# Patient Record
Sex: Female | Born: 1980 | Race: White | Hispanic: No | Marital: Single | Smoking: Never smoker
Health system: Southern US, Community
[De-identification: ages and names within clinical notes are randomized; demographics above are authoritative.]

## PROBLEM LIST (undated history)

## (undated) DIAGNOSIS — D649 Anemia, unspecified: Secondary | ICD-10-CM

## (undated) DIAGNOSIS — E079 Disorder of thyroid, unspecified: Secondary | ICD-10-CM

## (undated) HISTORY — DX: Disorder of thyroid, unspecified: E07.9

## (undated) HISTORY — PX: WISDOM TOOTH EXTRACTION: SHX21

## (undated) HISTORY — DX: Anemia, unspecified: D64.9

---

## 2003-06-20 ENCOUNTER — Other Ambulatory Visit: Admission: RE | Admit: 2003-06-20 | Discharge: 2003-06-20 | Payer: Self-pay | Admitting: Obstetrics & Gynecology

## 2004-02-18 ENCOUNTER — Other Ambulatory Visit: Admission: RE | Admit: 2004-02-18 | Discharge: 2004-02-18 | Payer: Self-pay | Admitting: Obstetrics and Gynecology

## 2006-09-18 ENCOUNTER — Inpatient Hospital Stay (HOSPITAL_COMMUNITY): Admission: AD | Admit: 2006-09-18 | Discharge: 2006-09-20 | Payer: Self-pay | Admitting: Obstetrics & Gynecology

## 2007-09-10 ENCOUNTER — Ambulatory Visit: Payer: Self-pay | Admitting: Gynecology

## 2007-09-12 ENCOUNTER — Ambulatory Visit (HOSPITAL_COMMUNITY): Admission: RE | Admit: 2007-09-12 | Discharge: 2007-09-12 | Payer: Self-pay | Admitting: Gynecology

## 2007-09-17 ENCOUNTER — Encounter: Payer: Self-pay | Admitting: Obstetrics & Gynecology

## 2007-09-17 ENCOUNTER — Ambulatory Visit: Payer: Self-pay | Admitting: Gynecology

## 2007-10-08 ENCOUNTER — Ambulatory Visit: Payer: Self-pay | Admitting: Gynecology

## 2007-11-07 ENCOUNTER — Ambulatory Visit: Payer: Self-pay | Admitting: Obstetrics & Gynecology

## 2007-11-26 ENCOUNTER — Ambulatory Visit: Payer: Self-pay | Admitting: Gynecology

## 2007-12-05 ENCOUNTER — Ambulatory Visit: Payer: Self-pay | Admitting: Obstetrics & Gynecology

## 2007-12-20 ENCOUNTER — Ambulatory Visit: Payer: Self-pay | Admitting: Obstetrics & Gynecology

## 2007-12-24 ENCOUNTER — Ambulatory Visit (HOSPITAL_COMMUNITY): Admission: RE | Admit: 2007-12-24 | Discharge: 2007-12-24 | Payer: Self-pay | Admitting: Gynecology

## 2007-12-27 ENCOUNTER — Ambulatory Visit: Payer: Self-pay | Admitting: Obstetrics & Gynecology

## 2007-12-31 ENCOUNTER — Ambulatory Visit: Payer: Self-pay | Admitting: Gynecology

## 2008-01-03 ENCOUNTER — Ambulatory Visit: Payer: Self-pay | Admitting: Gynecology

## 2008-01-07 ENCOUNTER — Ambulatory Visit: Payer: Self-pay | Admitting: Gynecology

## 2008-01-10 ENCOUNTER — Ambulatory Visit: Payer: Self-pay | Admitting: Obstetrics & Gynecology

## 2008-01-14 ENCOUNTER — Ambulatory Visit (HOSPITAL_COMMUNITY): Admission: RE | Admit: 2008-01-14 | Discharge: 2008-01-14 | Payer: Self-pay | Admitting: Obstetrics & Gynecology

## 2008-01-17 ENCOUNTER — Ambulatory Visit: Payer: Self-pay | Admitting: Obstetrics & Gynecology

## 2008-01-21 ENCOUNTER — Ambulatory Visit: Payer: Self-pay | Admitting: Obstetrics & Gynecology

## 2008-01-24 ENCOUNTER — Ambulatory Visit: Payer: Self-pay | Admitting: Obstetrics & Gynecology

## 2008-01-26 ENCOUNTER — Inpatient Hospital Stay (HOSPITAL_COMMUNITY): Admission: AD | Admit: 2008-01-26 | Discharge: 2008-01-28 | Payer: Self-pay | Admitting: Obstetrics & Gynecology

## 2008-01-26 ENCOUNTER — Ambulatory Visit: Payer: Self-pay | Admitting: Family

## 2008-03-20 ENCOUNTER — Ambulatory Visit: Payer: Self-pay | Admitting: Obstetrics & Gynecology

## 2008-08-17 IMAGING — US US OB COMP +14 WK
2 series · 14 of 28 positions shown · non-contrast
Comparison: none

OBSTETRICAL ULTRASOUND:

 This ultrasound exam was performed in the [HOSPITAL] Ultrasound Department.  The OB US report was generated in the AS system, and faxed to the ordering physician.  This report is also available in [REDACTED] PACS.

[Series 1: us ob comp +14 wk · 1 of 5 slices shown (1 of 2)]
[im 5/5]
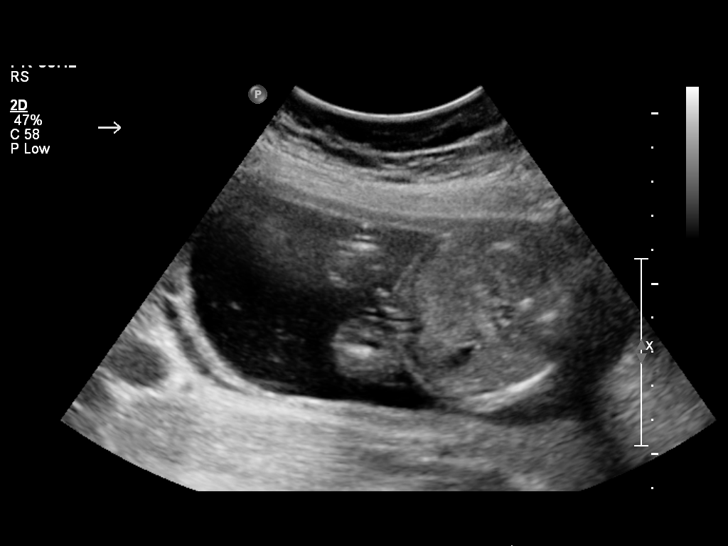

[Series 1: us ob comp +14 wk · 13 of 52 slices shown (2 of 2)]
[im 3/52]
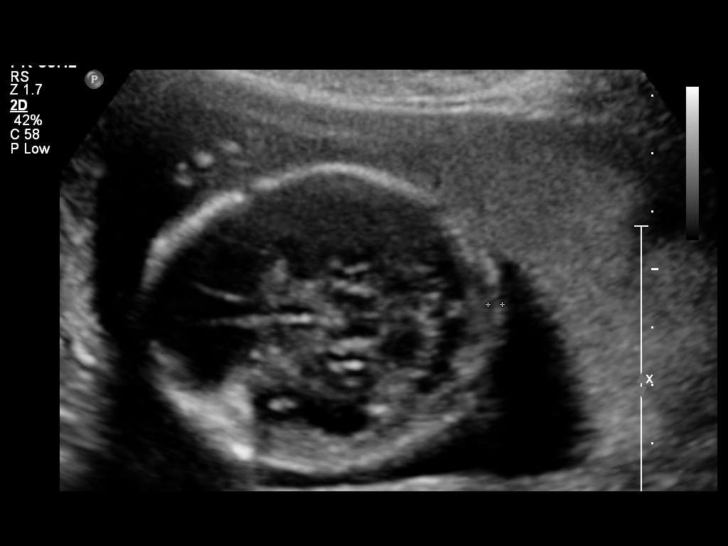
[im 7/52]
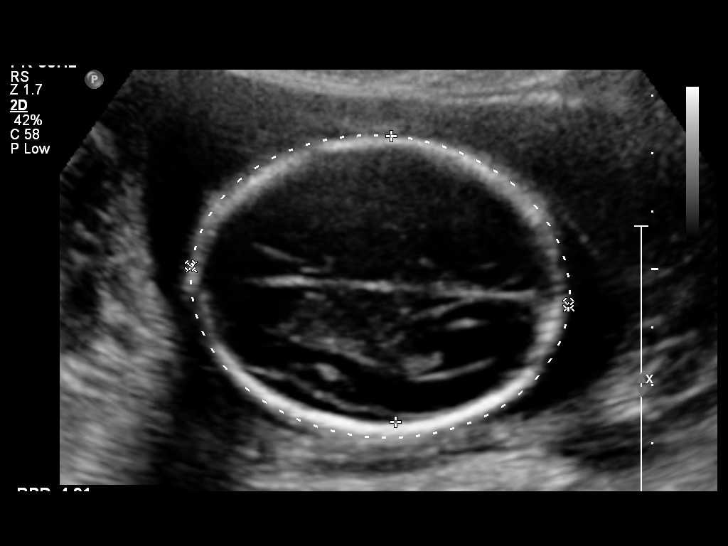
[im 11/52]
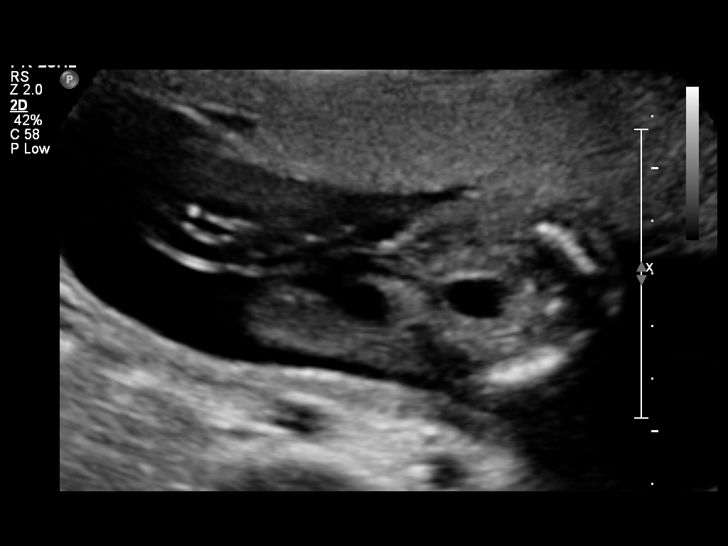
[im 15/52]
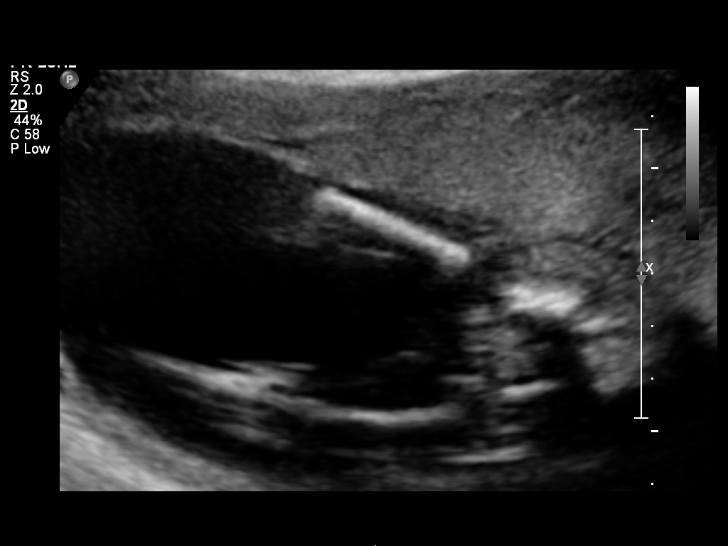
[im 19/52]
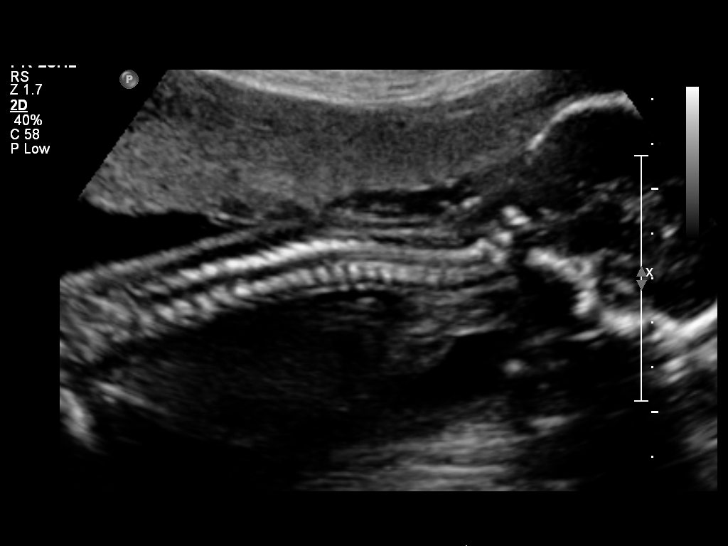
[im 23/52]
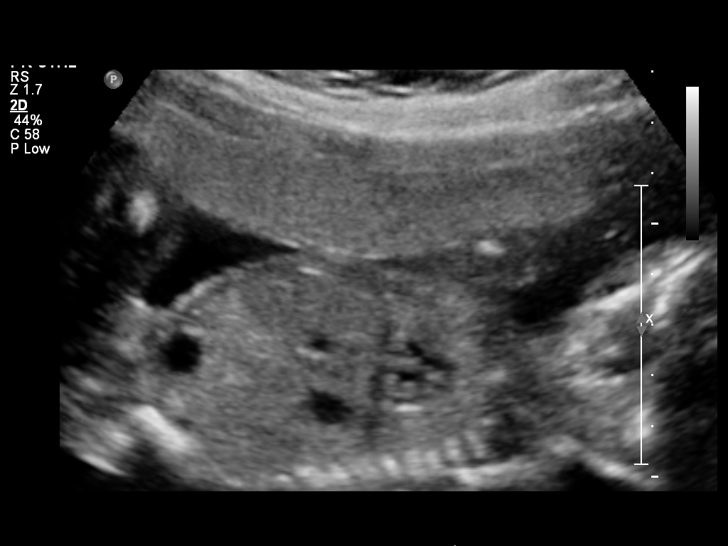
[im 27/52]
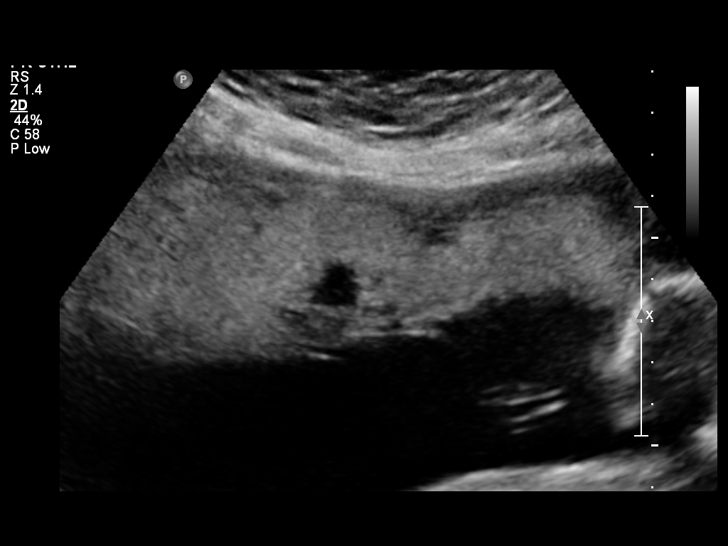
[im 31/52]
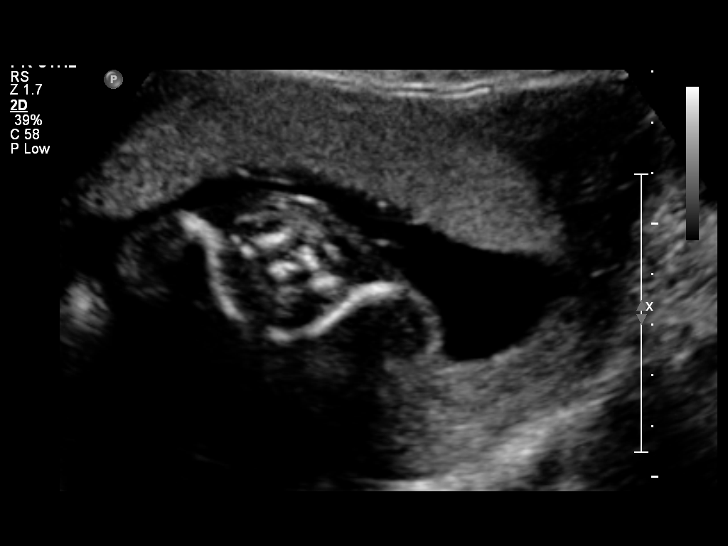
[im 35/52]
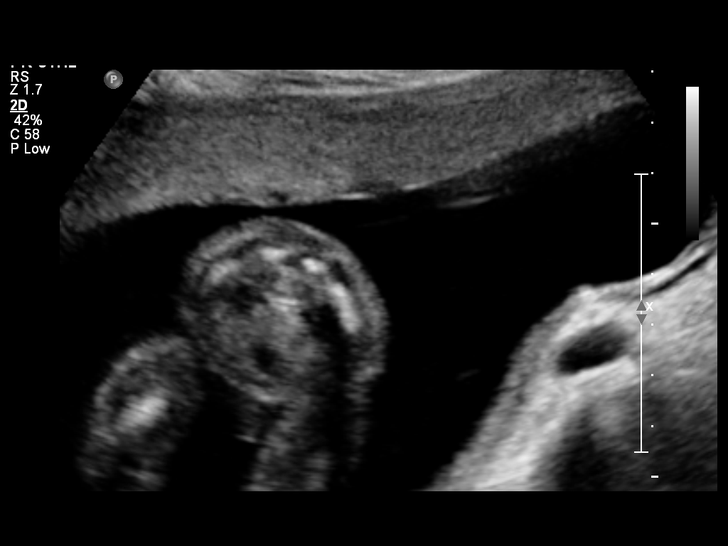
[im 39/52]
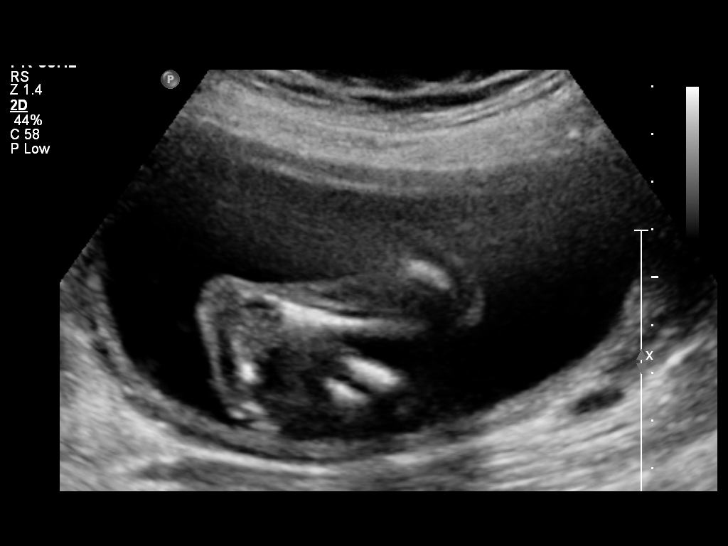
[im 43/52]
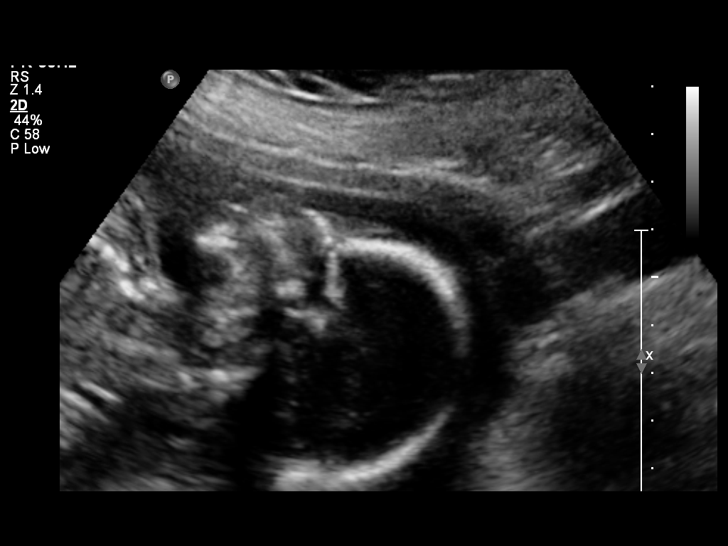
[im 47/52]
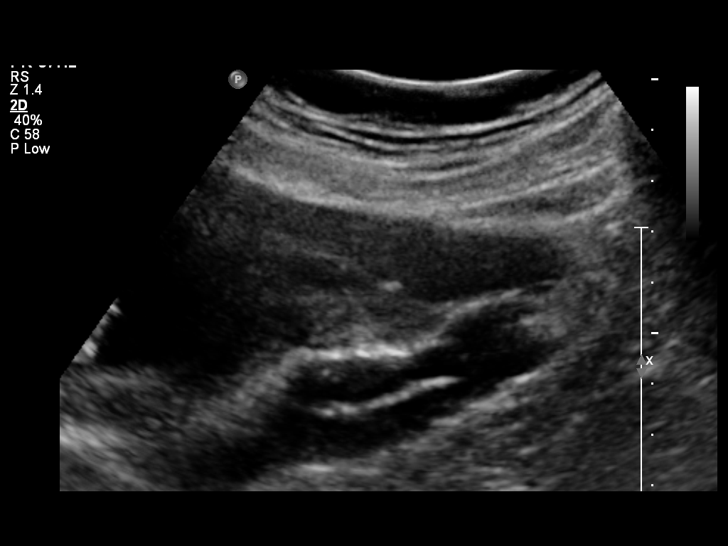
[im 52/52]
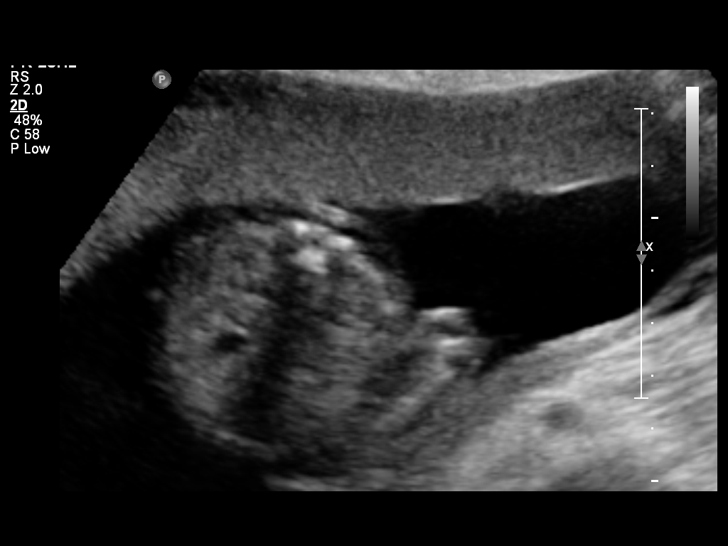

[14 of 28 positions shown; findings below may reference images not displayed]

IMPRESSION: See AS Obstetric US report.

## 2008-11-28 IMAGING — US US OB FOLLOW-UP
1 series · 14 of 28 positions shown · non-contrast
Comparison: none

OBSTETRICAL ULTRASOUND:

 This ultrasound exam was performed in the [HOSPITAL] Ultrasound Department.  The OB US report was generated in the AS system, and faxed to the ordering physician.  This report is also available in [REDACTED] PACS.

[Series 1: us ob re-eval · 44 acquisitions, 14 frames shown]
[im 2/44]
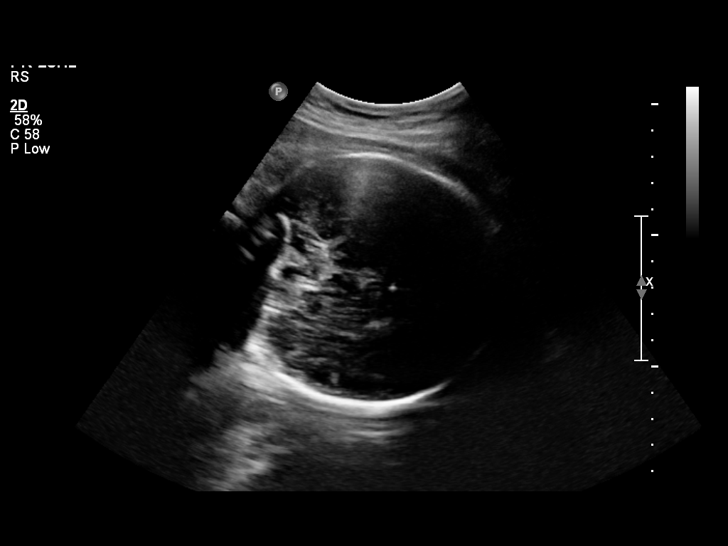
[im 5/44]
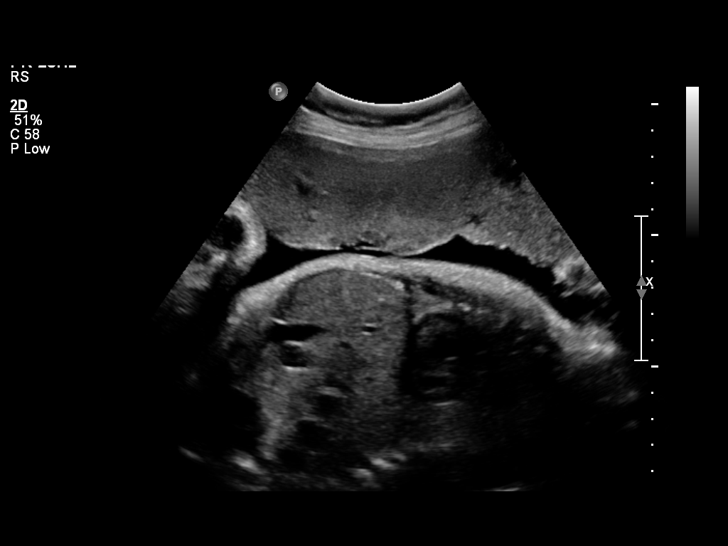
[im 8/44]
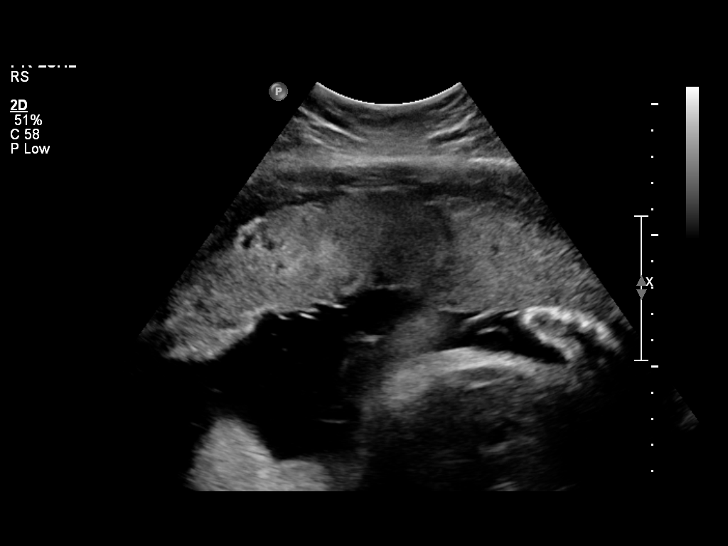
[im 12/44]
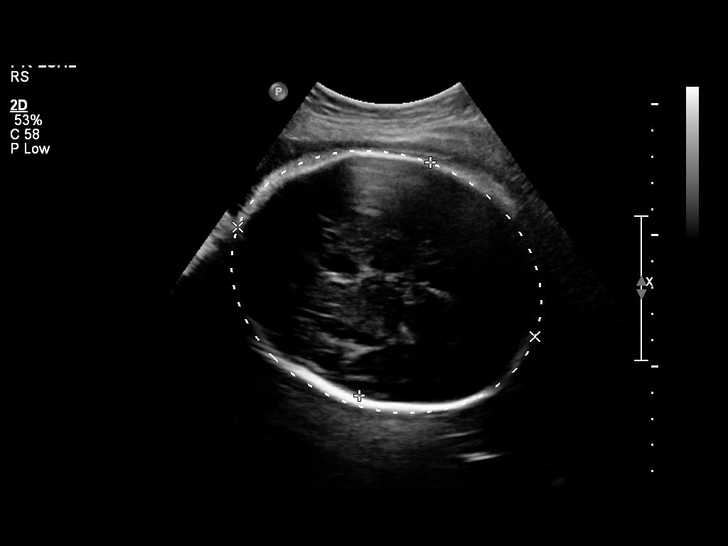
[im 15/44]
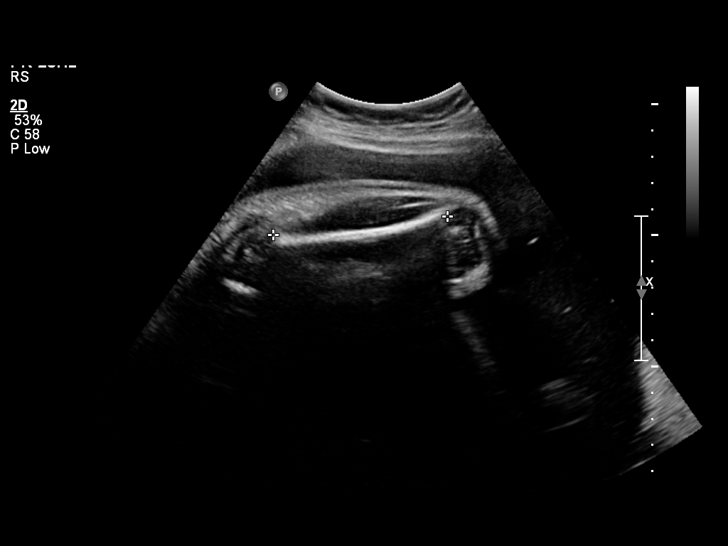
[im 18/44]
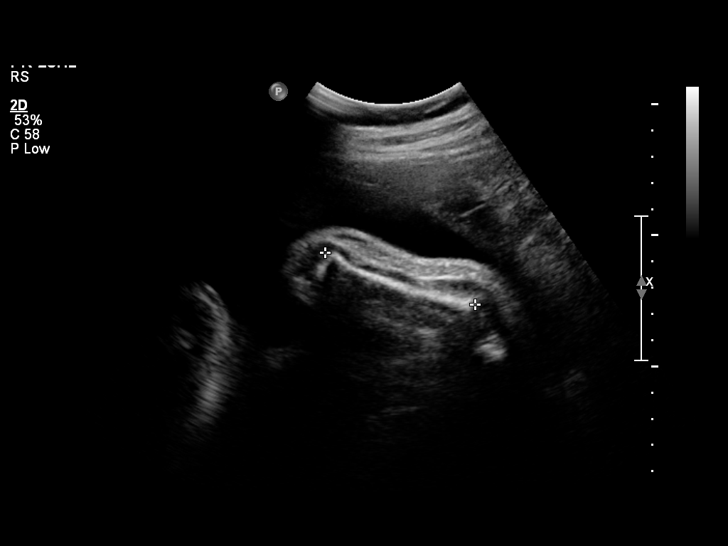
[im 21/44]
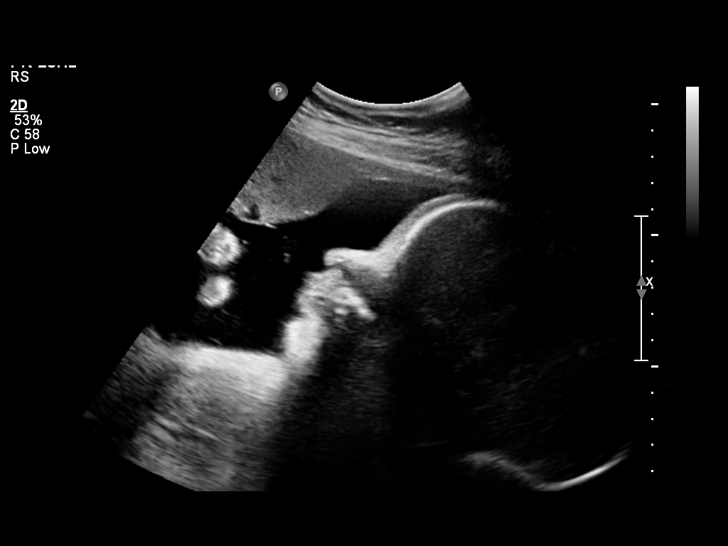
[im 24/44]
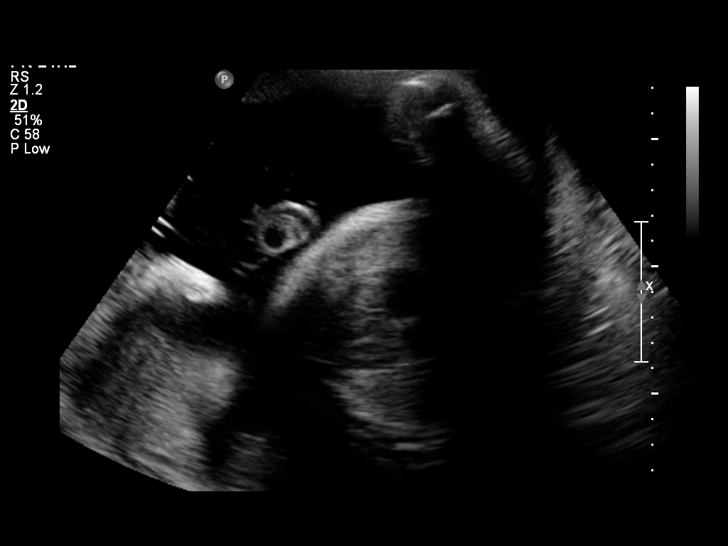
[im 28/44]
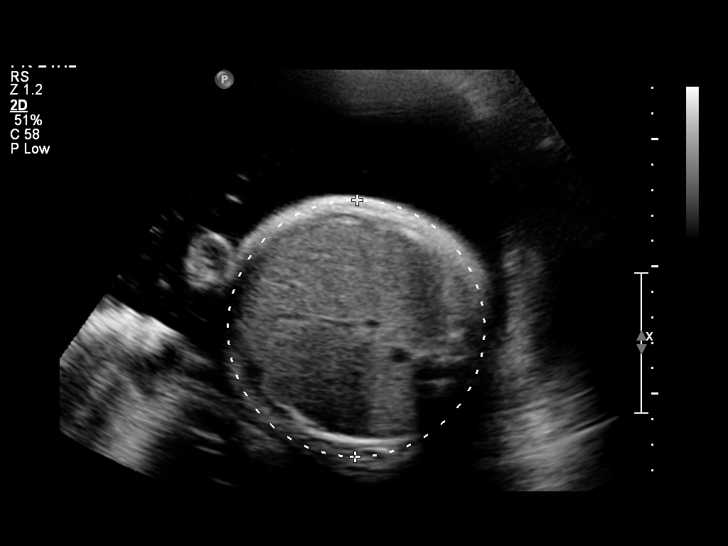
[im 31/44]
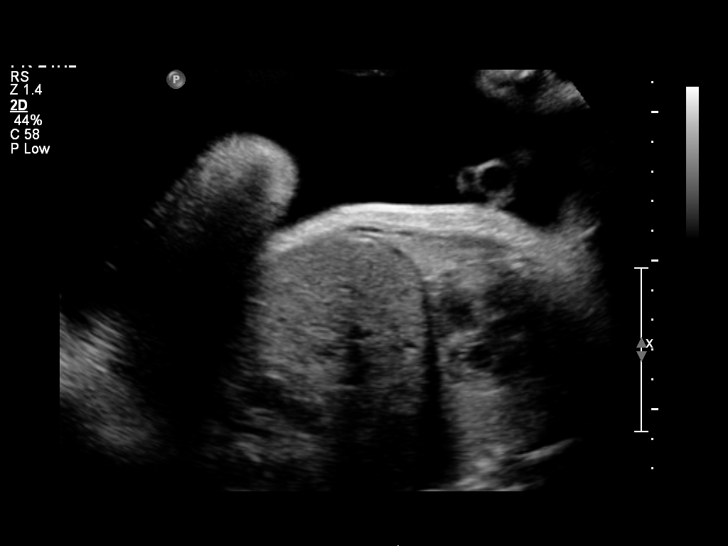
[im 34/44]
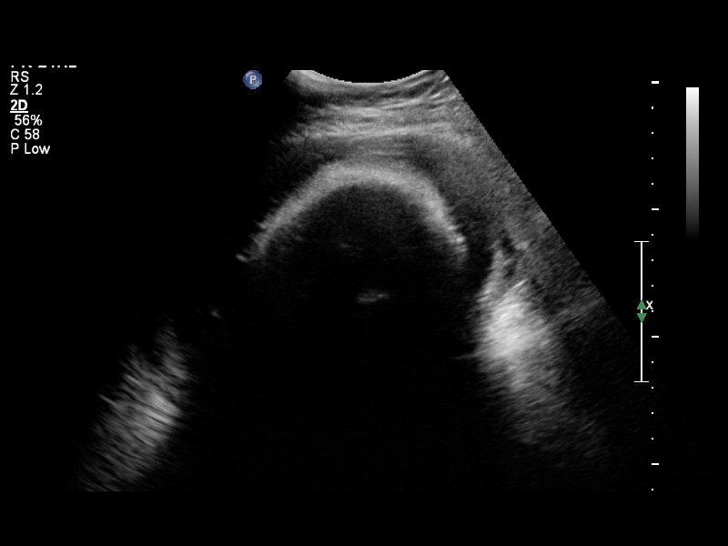
[im 37/44]
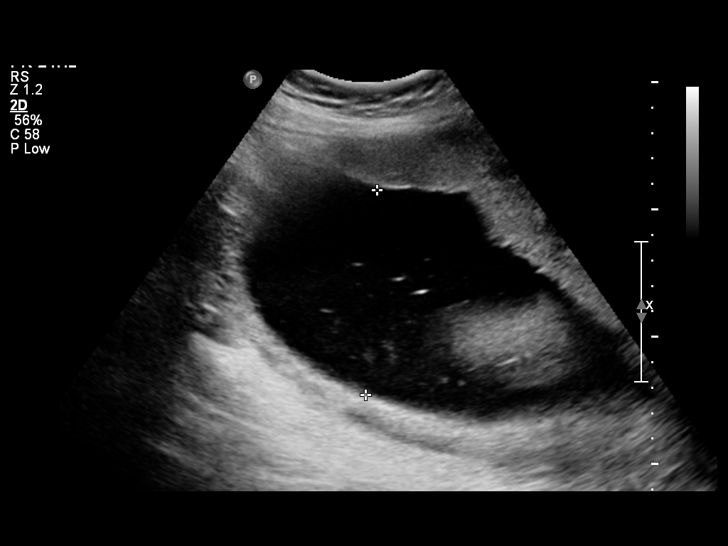
[im 40/44]
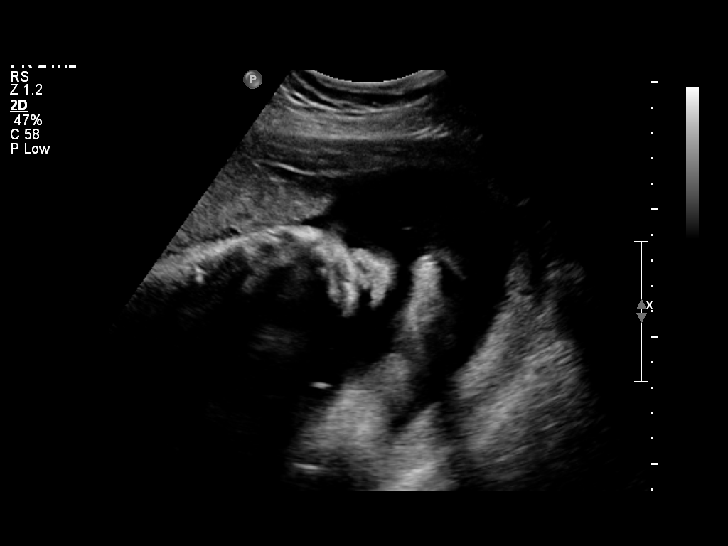
[im 44/44]
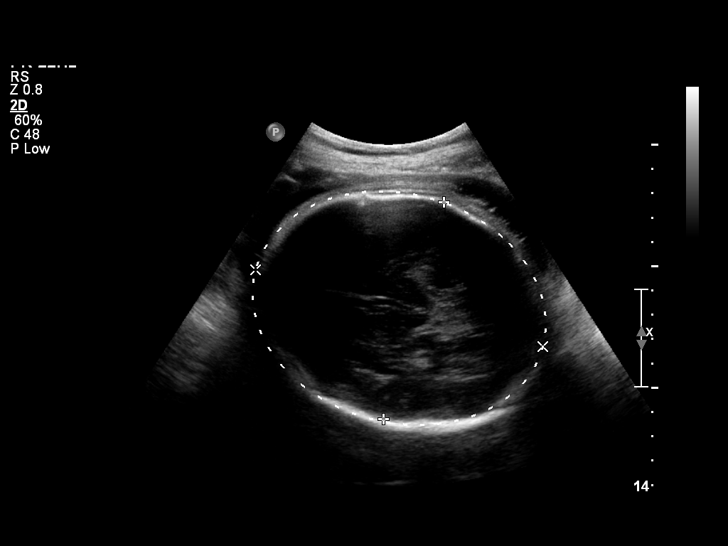

[14 of 28 positions shown; findings below may reference images not displayed]

IMPRESSION: See AS Obstetric US report.

## 2008-12-19 IMAGING — US US FETAL BPP W/O NONSTRESS
1 series · 11 of 11 positions shown · non-contrast
Comparison: none

OBSTETRICAL ULTRASOUND:
 This ultrasound was performed in The [HOSPITAL], and the AS OB/GYN report will be stored to [REDACTED] PACS.

[Series 1: us fetal bpp w/o nonstress · 0.31mm/px · 11 acquisitions, 11 frames shown]
[im 1/11]
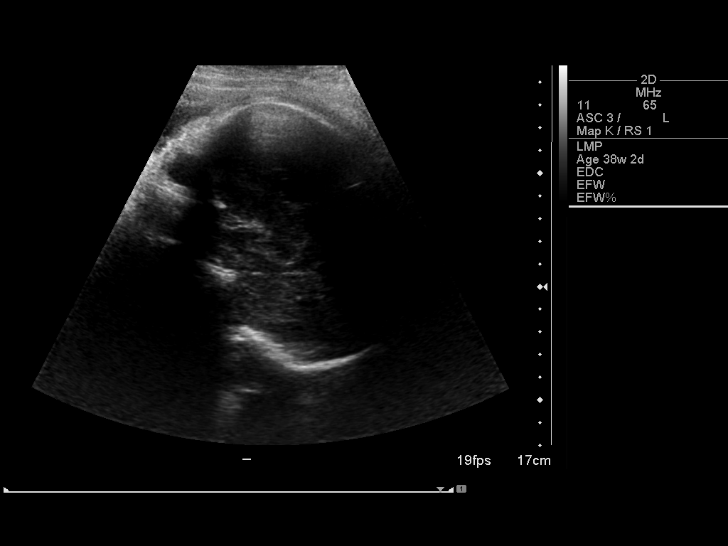
[im 2/11]
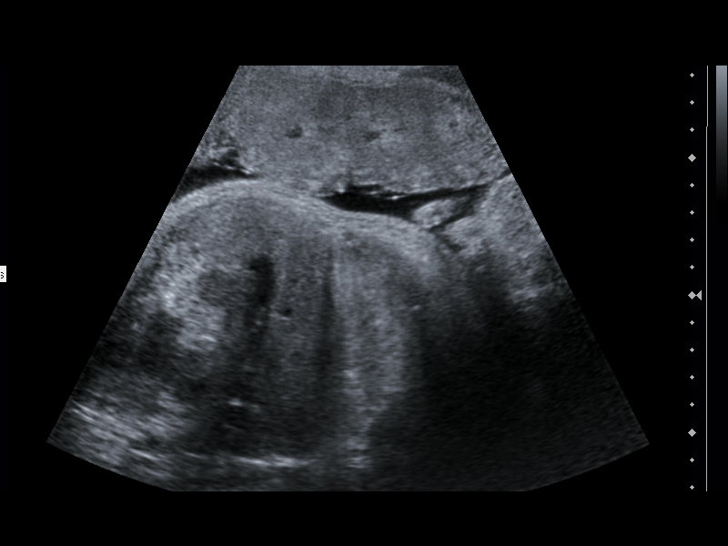
[im 3/11]
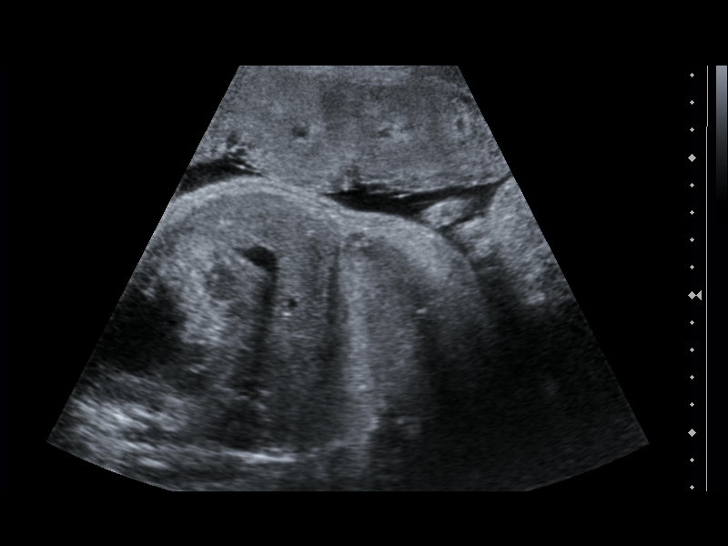
[im 4/11]
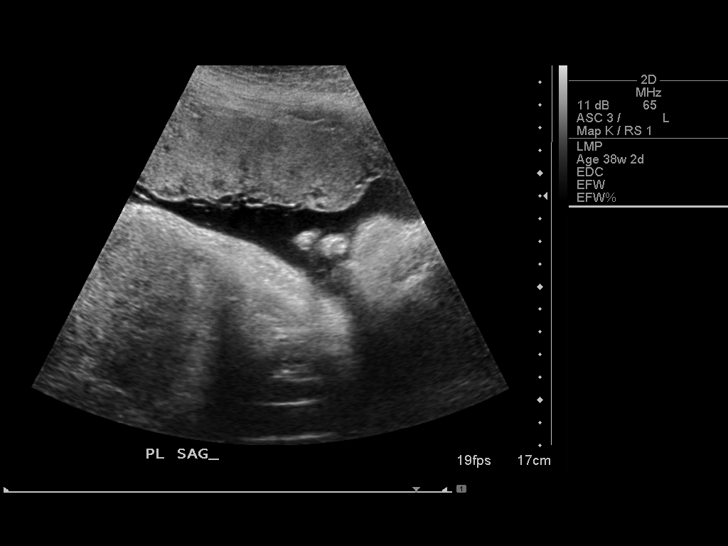
[im 5/11]
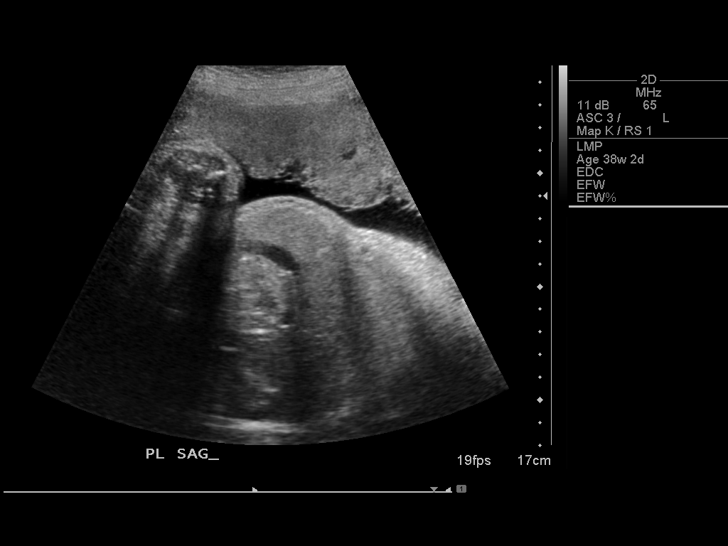
[im 6/11]
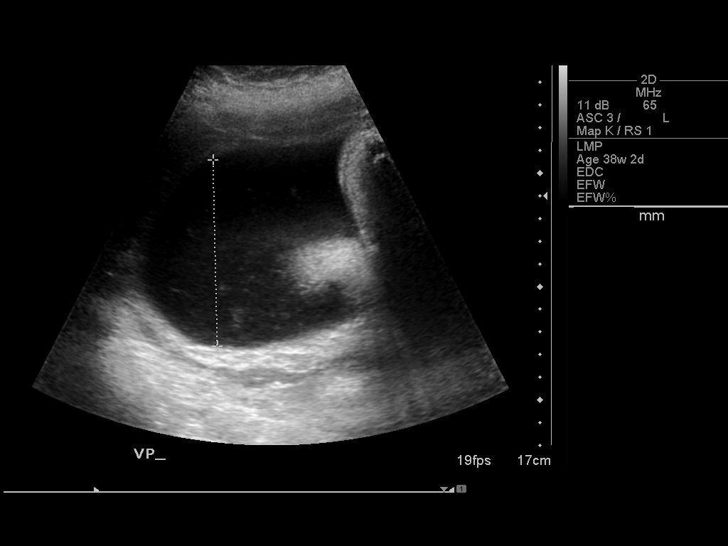
[im 7/11]
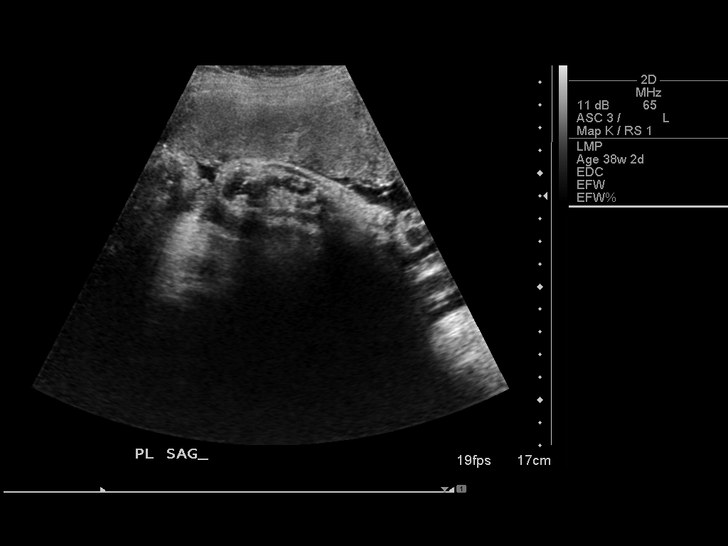
[im 8/11]
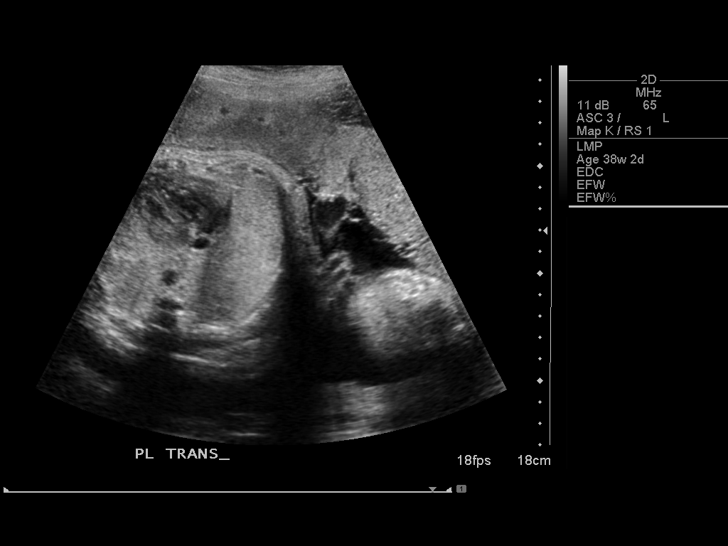
[im 9/11]
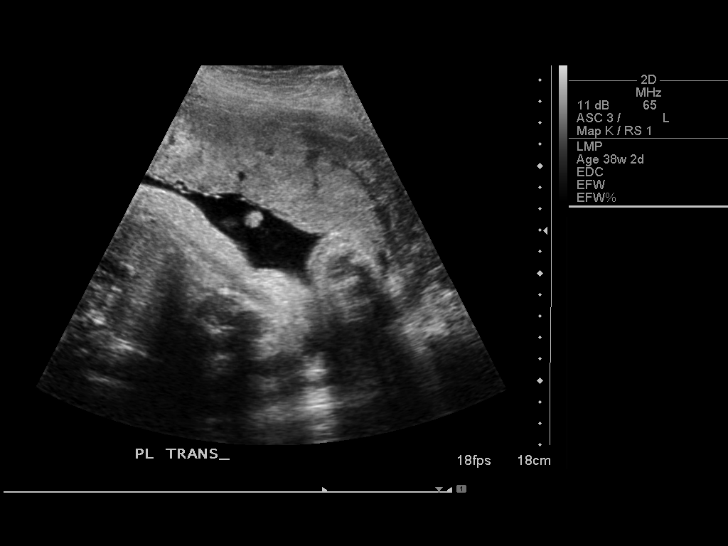
[im 10/11]
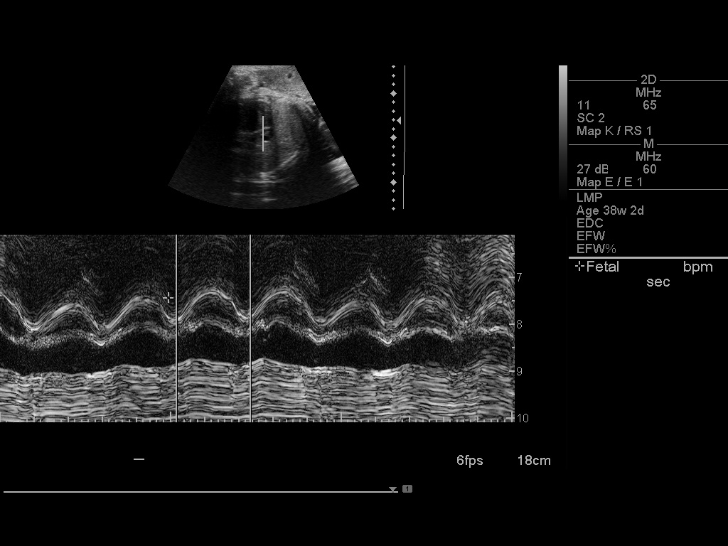
[im 11/11]
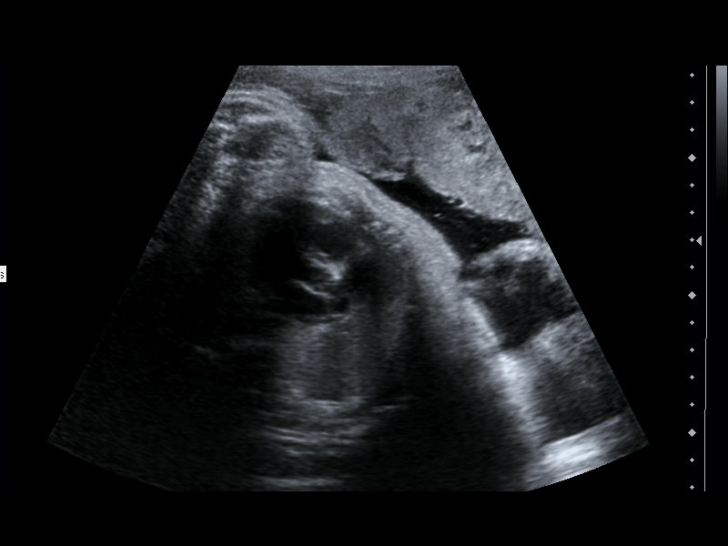

[11 of 11 positions shown; findings below may reference images not displayed]

IMPRESSION: The AS OB/GYN report has also been faxed to the ordering physician.

## 2009-02-26 ENCOUNTER — Encounter: Payer: Self-pay | Admitting: Obstetrics & Gynecology

## 2009-02-26 ENCOUNTER — Other Ambulatory Visit: Admission: RE | Admit: 2009-02-26 | Discharge: 2009-02-26 | Payer: Self-pay | Admitting: Obstetrics & Gynecology

## 2009-02-26 ENCOUNTER — Ambulatory Visit: Payer: Self-pay | Admitting: Obstetrics & Gynecology

## 2009-03-31 ENCOUNTER — Ambulatory Visit: Payer: Self-pay | Admitting: Obstetrics & Gynecology

## 2010-05-24 ENCOUNTER — Other Ambulatory Visit: Admission: RE | Admit: 2010-05-24 | Discharge: 2010-05-24 | Payer: Self-pay | Admitting: Obstetrics and Gynecology

## 2010-05-24 ENCOUNTER — Ambulatory Visit: Payer: Self-pay | Admitting: Obstetrics and Gynecology

## 2010-06-08 ENCOUNTER — Ambulatory Visit: Payer: Self-pay | Admitting: Obstetrics & Gynecology

## 2010-07-08 ENCOUNTER — Ambulatory Visit: Payer: Self-pay | Admitting: Obstetrics & Gynecology

## 2011-03-08 NOTE — Assessment & Plan Note (Signed)
NAME:  [name] [name], [name] [name]                ACCOUNT NO.:  [id number]   MEDICAL RECORD NO.:  [id number]          PATIENT TYPE:  POB   LOCATION:  [facility] at [facility]         FACILITY:  [facility]   PHYSICIAN:  Scheryl Darter, MD       DATE OF BIRTH:  [birth year]   DATE OF SERVICE:                                  CLINIC NOTE   The patient requests IUD insertion.  The patient is a 30 year old white  female gravida 2, para 2, last menstrual period May 24, 2010.  She has  had intercourse one time 1 week ago using condoms since her last period.  She is confident that she is not pregnant.  She had negative pregnancy  test today.  She requests IUD insertion with Mirena.  She has reviewed  the literature on this.  We discussed the method of contraception, risk  of failure bleeding, infection, uterine perforation, and pain.  Questions were answered.  She signed consent.   PHYSICAL EXAMINATION:  GENERAL:  She is in no acute distress and affect  is normal.  PELVIC:  External genitalia, vagina and cervix appeared normal.   Cervix was prepped with Betadine and grasped with single-tooth  tenaculum.  Uterus sounded to 9 cm.  A Mirena was placed in the usual  fashion without difficulty.  String was trimmed to about 3 cm.  She  tolerated this well.  She can return in about 4 weeks to have an IUD  check.  She will report if she has problems with severe pain, bleeding,  discharge, or fever.      Scheryl Darter, MD     JA/MEDQ  D:  06/08/2010  T:  06/09/2010  Job:  [id number]

## 2011-03-08 NOTE — Assessment & Plan Note (Signed)
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  The patient is back for her routine exam and a Pap, has  been using condoms up until now and wanted something more secure for  birth control.  We talked to her about the IUD, about the birth control  pill, NuvaRing, and Depo-Provera.  She has decided on the Mirena IUD.  We are going to give her an information booklet to take home, have her  come back in next week after Pap smear results come back.   PHYSICAL EXAMINATION:  VITAL SIGNS:  Today; her blood pressure was  126/74, weight 167, height 5 feet 4 inches tall.  NECK:  Thyroid symmetrical, no masses.  BREASTS:  Symmetrical.  No dominant masses.  No nipple discharge.  No  supraclavicular or axillary nodes.  ABDOMEN:  Soft, flat, nontender.  No masses.  No organomegaly.  PELVIC:  Genitalia, external is normal.  BUS within normal limits.  Vagina is clean, well rugated.  Cervix is clean, parous with a slight  ectropion.  The uterus is anterior, normal size, shape, consistency, and  the adnexa is normal.   IMPRESSION:  Normal gynecological examination.  Pap smear was taken.  Consultation for contraception.  The patient will return for Mirena  intrauterine device insertion in 1 week.           ______________________________  Argentina Donovan, MD     PR/MEDQ  D:  05/24/2010  T:  05/25/2010  Job:  [id number]

## 2011-03-08 NOTE — Assessment & Plan Note (Signed)
NAME:  [name] [name], [name]                ACCOUNT NO.:  [id number]   MEDICAL RECORD NO.:  [id number]          PATIENT TYPE:  POB   LOCATION:  [facility] at [facility]         FACILITY:  [facility]   PHYSICIAN:  Scheryl Darter, MD       DATE OF BIRTH:  06/02/1981   DATE OF SERVICE:                                  CLINIC NOTE   The patient returns for IUD check.  IUD was placed 1 month ago.  She has  had irregular bleeding and she notes more bleeding after intercourse and  also lower abdominal cramping.  No pain today.  She does have some  spotting.   Physical exam, her affect is normal.  Her abdomen is soft, nontender.  Pelvic exam; external genitalia, vagina and cervix showed moderate  amount of blood..  IUD string was about 3 cm and was noted at the time  of insertion.  Cervix is mildly opened to about a centimeter at the  external os and the tip of the IUD was not palpable through the os.  Uterus is normal size, nontender.  No adnexal masses.   IMPRESSION:  Irregular bleeding after intrauterine device placement.   PLAN:  She will report if her symptoms worsen, otherwise we will manage  expectantly currently.      Scheryl Darter, MD     JA/MEDQ  D:  07/08/2010  T:  07/09/2010  Job:  [id number]

## 2011-03-08 NOTE — Assessment & Plan Note (Signed)
NAME:  [name], [name]                ACCOUNT NO.:  [id number]   MEDICAL RECORD NO.:  [id number]          PATIENT TYPE:  POB   LOCATION:  [facility] at [facility]         FACILITY:  [facility]   PHYSICIAN:  Allie Bossier, MD        DATE OF BIRTH:  Jan 31, 1981   DATE OF SERVICE:  02/26/2009                                  CLINIC NOTE   [name] [name] is a 30 year old single white gravida 2, para 2, who comes  in here for annual exam.  She would like to start some birth control, so  has been abstinent since January, although she lives with her  partner/father of baby.  She does not want another child for more than 5  years in the future.  I have discussed Mirena and Implanon as well as  pills (she was a previous pill user).  I have given her handouts.   PAST MEDICAL HISTORY:  None.   PAST SURGICAL HISTORY:  None.   REVIEW OF SYSTEMS:  She is monogamous for the last 4 years and lives  with father of the baby.  She works at [employer] on [address].  She has no  particular other complaints.   FAMILY HISTORY:  Negative for breast, GYN, and colon malignancy.  Her  father does have diabetes.   SOCIAL HISTORY:  Negative for tobacco, alcohol, or drug use.   ALLERGIES:  No known drug allergies.  No latex allergies.   PHYSICAL EXAMINATION:  VITAL SIGNS:  Weight 173 pounds, blood pressure  117/69, height 5 feet and 4 inches, and pulse is 94.  HEENT:  Normal.  HEART:  Regular rate and rhythm.  LUNGS:  Clear to auscultation bilaterally.  ABDOMEN:  Benign.  BREASTS:  Normal bilaterally.  EXTERNAL GENITALIA:  No lesions.  Cervix parous, no lesions.  Uterus,  normal size and shape, midplane, anteverted, nontender.  Adnexa  nontender, no masses.   ASSESSMENT AND PLAN:  Annual exam.  I checked the Pap smear with  cervical cultures.  We recommended self-breast and self vulvar exams  monthly.  With regard to her birth control, I have given her information  verbal and she will come back in 1-2 weeks with a  decision made of what  she wants.     Allie Bossier, MD    MCD/MEDQ  D:  02/26/2009  T:  02/27/2009  Job:  [id number]

## 2011-03-08 NOTE — Assessment & Plan Note (Signed)
NAME:  [name], [name]                ACCOUNT NO.:  [id number]   MEDICAL RECORD NO.:  [id number]          PATIENT TYPE:  POB   LOCATION:  [facility] at [facility]         FACILITY:  [facility]   PHYSICIAN:  Elsie Lincoln, MD      DATE OF BIRTH:  1980-11-07   DATE OF SERVICE:  03/20/2008                                  CLINIC NOTE    The patient is a 30 year old female who delivered a baby on January 26, 2008.  The patient had a NSVD of a female infant weighing 4170 grams,  which is 9 pounds 3 ounces.  Apgars were 9 and 9.  The patient had a  first-degree perineal laceration and a left labial laceration.  The  patient is not breast feeding, and she denies any complaints of  postpartum depression or mood disturbances.  The patient is not sexually  active yet.  The father of the baby is currently in jail, undergoing  trial for some type of crime.  The patient believes he is innocent and  will be released.  She is not interested in starting a birth control at  this time.   PAST MEDICAL HISTORY:  She has HSV, obesity, and had polyhydramnios in  pregnancy.  She was also GBS positive in pregnancy.   PAST SURGICAL HISTORY:  Denies any surgeries.   REVIEW OF SYSTEMS:  Negative.   MEDICATIONS:  None.   PHYSICAL EXAMINATION:  VITAL SIGNS:  Pulse 63, blood pressure 111/71,  weight 194, and height 5 feet 4-1/2 inches.  GENERAL:  Well-nourished, well-developed, and in no apparent distress.  HEENT:  Normocephalic and atraumatic.  LUNGS:  Normal inspiratory efforts.  BREASTS:  No masses.  No evidence of nipple discharge or mastitis.  ABDOMEN:  Soft and nontender.  GENITALIA:  Tanner V.  Perineum intact.  Vagina, pink and normal rugae.  Cervix, closed and nontender.  Adnexa, no masses and nontender.  Uterus,  normal size and contour.  EXTREMITIES:  No edema.   ASSESSMENT/PLAN:  A 30 year old female for postpartum exam.  1. Pap smear due in November 2009.  2. Come back for birth control, if father of the  baby is released from      jail or if she becomes sexually active.          ______________________________  Elsie Lincoln, MD    KL/MEDQ  D:  03/20/2008  T:  03/20/2008  Job:  [id number]

## 2011-07-18 ENCOUNTER — Ambulatory Visit (INDEPENDENT_AMBULATORY_CARE_PROVIDER_SITE_OTHER): Payer: Self-pay | Admitting: Obstetrics & Gynecology

## 2011-07-18 ENCOUNTER — Encounter: Payer: Self-pay | Admitting: Obstetrics & Gynecology

## 2011-07-18 VITALS — BP 97/61 | HR 71 | Ht 64.0 in | Wt 173.0 lb

## 2011-07-18 DIAGNOSIS — Z30017 Encounter for initial prescription of implantable subdermal contraceptive: Secondary | ICD-10-CM

## 2011-07-18 DIAGNOSIS — Z309 Encounter for contraceptive management, unspecified: Secondary | ICD-10-CM

## 2011-07-18 NOTE — Progress Notes (Signed)
  Subjective:    Patient ID: [name] [name], female    DOB: 1981-03-04, 30 y.o.   MRN: [id number]  HPI Her IUD fell out in the shower yesterday. She has not had sex since then. She wants to wait a few years before having another baby. Her "periods" are mostly occasional spotting.   Review of Systems Pap done 8/11    Objective:   Physical Exam  Questions were answered, a consent signed, and her arm was prepped with betadine.  2 cc of 1% lidocaine was injected into her left arm about 8 cm up from her elbow.  The Implanon was placed without difficulty.  Her arm was bandaged.      Assessment & Plan:  Contraception- Implanon placed She will schedule an annual exam.

## 2011-07-18 NOTE — Progress Notes (Signed)
Mirena IUD fell out in the shower yesterday.  She has been cramping since. Has had the IUD for one year.

## 2011-07-19 LAB — CBC
HCT: 30.8 — ABNORMAL LOW
HCT: 33.9 — ABNORMAL LOW
MCHC: 34.8
Platelets: 217
RBC: 3.49 — ABNORMAL LOW
RBC: 3.9
RDW: 14.8

## 2011-07-19 LAB — CCBB MATERNAL DONOR DRAW

## 2012-04-10 ENCOUNTER — Encounter: Payer: Self-pay | Admitting: Obstetrics and Gynecology

## 2012-04-10 ENCOUNTER — Ambulatory Visit (INDEPENDENT_AMBULATORY_CARE_PROVIDER_SITE_OTHER): Payer: Medicaid Other | Admitting: Obstetrics and Gynecology

## 2012-04-10 VITALS — BP 137/67 | HR 75 | Ht 64.0 in | Wt 173.0 lb

## 2012-04-10 DIAGNOSIS — Z3046 Encounter for surveillance of implantable subdermal contraceptive: Secondary | ICD-10-CM

## 2012-04-10 MED ORDER — NORGESTIMATE-ETH ESTRADIOL 0.25-35 MG-MCG PO TABS: 1.0000 | ORAL_TABLET | Freq: Every day | ORAL | Status: DC

## 2012-04-10 NOTE — Progress Notes (Signed)
Patient ID: [name] [name], female   DOB: April 14, 1981, 31 y.o.   MRN: [id number] 31 yo presenting today requesting Implanon removal. Patient had Implanon placed in 06/2011 and reports heavy irregular bleeding. Patient is aware that it was one of the side effects but did not expect it to be to this extent.  Patient planning on using OCp for birth control. She has taken OCP in the past without any complications  Implanon removal Patient given informed consent for removal of her Implanon, time out was performed.  Signed copy in the chart.  Appropriate time out taken. Implanon site identified.  Area prepped in usual sterile fashon. One cc of 1% lidocaine was used to anesthetize the area at the distal end of the implant. A small stab incision was made right beside the implant on the distal portion.  The implanon rod was grasped using hemostats and removed without difficulty.  There was less than 3 cc blood loss. There were no complications.  A small amount of antibiotic ointment and steri-strips were applied over the small incision.  A pressure bandage was applied to reduce any bruising.  The patient tolerated the procedure well and was given post procedure instructions.

## 2013-12-02 ENCOUNTER — Encounter: Payer: Self-pay | Admitting: Obstetrics & Gynecology

## 2013-12-02 ENCOUNTER — Ambulatory Visit (INDEPENDENT_AMBULATORY_CARE_PROVIDER_SITE_OTHER): Payer: Medicaid Other | Admitting: Obstetrics & Gynecology

## 2013-12-02 VITALS — BP 127/90 | HR 81 | Ht 64.0 in | Wt 175.0 lb

## 2013-12-02 DIAGNOSIS — Z3041 Encounter for surveillance of contraceptive pills: Secondary | ICD-10-CM

## 2013-12-02 DIAGNOSIS — N390 Urinary tract infection, site not specified: Secondary | ICD-10-CM

## 2013-12-02 DIAGNOSIS — R300 Dysuria: Secondary | ICD-10-CM

## 2013-12-02 LAB — POCT URINALYSIS DIPSTICK
Bilirubin, UA: NEGATIVE
Glucose, UA: NEGATIVE
KETONES UA: NEGATIVE
Nitrite, UA: POSITIVE
PH UA: 6
Spec Grav, UA: >=1.03
Urobilinogen, UA: NEGATIVE

## 2013-12-02 MED ORDER — NORGESTIMATE-ETH ESTRADIOL 0.25-35 MG-MCG PO TABS: 1.0000 | ORAL_TABLET | Freq: Every day | ORAL | Status: DC

## 2013-12-02 MED ORDER — CIPROFLOXACIN HCL 500 MG PO TABS: 500.0000 mg | ORAL_TABLET | Freq: Two times a day (BID) | ORAL | Status: DC

## 2013-12-02 MED ORDER — PHENAZOPYRIDINE HCL 200 MG PO TABS: 200.0000 mg | ORAL_TABLET | Freq: Three times a day (TID) | ORAL | Status: DC | PRN

## 2013-12-02 NOTE — Progress Notes (Signed)
   CLINIC ENCOUNTER NOTE  History:  33 y.o.  [id number] here today for burning on urination for 3 days. No lesions or other symptoms.   The following portions of the patient's history were reviewed and updated as appropriate: allergies, current medications, past family history, past medical history, past social history, past surgical history and problem list.  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  Review of Systems:  Pertinent items are noted in HPI.  Objective:  Physical Exam BP 127/90  Pulse 81  Ht 5\' 4"  (1.626 m)  Wt 175 lb (79.379 kg)  BMI 30.02 kg/m2  LMP 11/02/2013 Gen: NAD Abd: Soft, mild suprapubic tenderness and nondistended Back: no CVAT Pelvic: Deferred  Labs and Imaging Results for orders placed in visit on 12/02/13 (from the past 24 hour(s))  POCT URINALYSIS DIPSTICK     Status: Abnormal   Collection Time    12/02/13  2:11 PM      Result Value Range   Color, UA YELLOW     Clarity, UA CLOUDY     Glucose, UA NEGATIVE     Bilirubin, UA NEGATIVE     Ketones, UA NEGATIVE     Spec Grav, UA >=1.030     Blood, UA 3+     pH, UA 6.0     Protein, UA 1+     Urobilinogen, UA negative     Nitrite, UA POSITIVE     Leukocytes, UA small (1+)      Assessment & Plan:  Patient has a UTI Ciprofloxacin prescribed Pyridium also prescribed Will follow up urine culture Return ASAP for annual exam and pap   Jaynie CollinsUGONNA  Abdoul Encinas, MD, FACOG Attending Obstetrician & Gynecologist Faculty Practice, [facility]

## 2013-12-02 NOTE — Patient Instructions (Signed)
Urinary Tract Infection  Urinary tract infections (UTIs) can develop anywhere along your urinary tract. Your urinary tract is your body's drainage system for removing wastes and extra water. Your urinary tract includes two kidneys, two ureters, a bladder, and a urethra. Your kidneys are a pair of bean-shaped organs. Each kidney is about the size of your fist. They are located below your ribs, one on each side of your spine.  CAUSES  Infections are caused by microbes, which are microscopic organisms, including fungi, viruses, and bacteria. These organisms are so small that they can only be seen through a microscope. Bacteria are the microbes that most commonly cause UTIs.  SYMPTOMS   Symptoms of UTIs may vary by age and gender of the patient and by the location of the infection. Symptoms in young women typically include a frequent and intense urge to urinate and a painful, burning feeling in the bladder or urethra during urination. Older women and men are more likely to be tired, shaky, and weak and have muscle aches and abdominal pain. A fever may mean the infection is in your kidneys. Other symptoms of a kidney infection include pain in your back or sides below the ribs, nausea, and vomiting.  DIAGNOSIS  To diagnose a UTI, your caregiver will ask you about your symptoms. Your caregiver also will ask to provide a urine sample. The urine sample will be tested for bacteria and white blood cells. White blood cells are made by your body to help fight infection.  TREATMENT   Typically, UTIs can be treated with medication. Because most UTIs are caused by a bacterial infection, they usually can be treated with the use of antibiotics. The choice of antibiotic and length of treatment depend on your symptoms and the type of bacteria causing your infection.  HOME CARE INSTRUCTIONS   If you were prescribed antibiotics, take them exactly as your caregiver instructs you. Finish the medication even if you feel better after you  have only taken some of the medication.   Drink enough water and fluids to keep your urine clear or pale yellow.   Avoid caffeine, tea, and carbonated beverages. They tend to irritate your bladder.   Empty your bladder often. Avoid holding urine for long periods of time.   Empty your bladder before and after sexual intercourse.   After a bowel movement, women should cleanse from front to back. Use each tissue only once.  SEEK MEDICAL CARE IF:    You have back pain.   You develop a fever.   Your symptoms do not begin to resolve within 3 days.  SEEK IMMEDIATE MEDICAL CARE IF:    You have severe back pain or lower abdominal pain.   You develop chills.   You have nausea or vomiting.   You have continued burning or discomfort with urination.  MAKE SURE YOU:    Understand these instructions.   Will watch your condition.   Will get help right away if you are not doing well or get worse.  Document Released: 07/20/2005 Document Revised: 04/10/2012 Document Reviewed: 11/18/2011  Exit[employer] Patient Information 2014 [employer].

## 2013-12-04 LAB — URINE CULTURE: Colony Count: >=100000

## 2013-12-17 ENCOUNTER — Ambulatory Visit: Payer: Medicaid Other | Admitting: Obstetrics & Gynecology

## 2013-12-24 ENCOUNTER — Ambulatory Visit: Payer: Medicaid Other | Admitting: Obstetrics & Gynecology

## 2014-01-20 ENCOUNTER — Ambulatory Visit (INDEPENDENT_AMBULATORY_CARE_PROVIDER_SITE_OTHER): Payer: Medicaid Other | Admitting: Family Medicine

## 2014-01-20 ENCOUNTER — Encounter: Payer: Self-pay | Admitting: Family Medicine

## 2014-01-20 ENCOUNTER — Other Ambulatory Visit (HOSPITAL_COMMUNITY)
Admission: RE | Admit: 2014-01-20 | Discharge: 2014-01-20 | Disposition: A | Discharge status: Home / Self Care | Payer: Medicaid Other | Source: Ambulatory Visit | Attending: Family Medicine | Admitting: Family Medicine

## 2014-01-20 VITALS — BP 131/65 | HR 73 | Ht 64.0 in | Wt 180.0 lb

## 2014-01-20 DIAGNOSIS — Z113 Encounter for screening for infections with a predominantly sexual mode of transmission: Secondary | ICD-10-CM | POA: Insufficient documentation

## 2014-01-20 DIAGNOSIS — Z124 Encounter for screening for malignant neoplasm of cervix: Secondary | ICD-10-CM | POA: Diagnosis not present

## 2014-01-20 DIAGNOSIS — Z3041 Encounter for surveillance of contraceptive pills: Secondary | ICD-10-CM

## 2014-01-20 DIAGNOSIS — Z01419 Encounter for gynecological examination (general) (routine) without abnormal findings: Secondary | ICD-10-CM | POA: Diagnosis not present

## 2014-01-20 DIAGNOSIS — Z1151 Encounter for screening for human papillomavirus (HPV): Secondary | ICD-10-CM | POA: Insufficient documentation

## 2014-01-20 LAB — CBC
HCT: 38.5 % (ref 36.0–46.0)
HEMOGLOBIN: 13.3 g/dL (ref 12.0–15.0)
MCH: 28.7 pg (ref 26.0–34.0)
MCHC: 34.5 g/dL (ref 30.0–36.0)
MCV: 83 fL (ref 78.0–100.0)
PLATELETS: 306 10*3/uL (ref 150–400)
RBC: 4.64 MIL/uL (ref 3.87–5.11)
RDW: 14.7 % (ref 11.5–15.5)
WBC: 9 10*3/uL (ref 4.0–10.5)

## 2014-01-20 NOTE — Patient Instructions (Signed)

## 2014-01-20 NOTE — Progress Notes (Signed)
  Subjective:     [name] [name] is a 33 y.o. female and is here for a comprehensive physical exam. The patient reports problems - bleeding with birth control.  Bled with Nexplanon and Mirena also.  Not ready for more kids at this point. No pap for 4-5 years. Mild constipation.  History   Social History  . Marital Status: Single    Spouse Name: N/A    Number of Children: N/A  . Years of Education: N/A   Occupational History  . Not on file.   Social History Main Topics  . Smoking status: Never Smoker   . Smokeless tobacco: Never Used  . Alcohol Use: Yes     Comment: rarely  . Drug Use: No  . Sexual Activity: Not on file   Other Topics Concern  . Not on file   Social History Narrative  . No narrative on file   Health Maintenance  Topic Date Due  . Pap Smear  08/29/2000  . Tetanus/tdap  08/29/2001  . Influenza Vaccine  05/24/2013    The following portions of the patient's history were reviewed and updated as appropriate: allergies, current medications, past family history, past medical history, past social history, past surgical history and problem list.  Review of Systems Pertinent items are noted in HPI.   Objective:    BP 131/65  Pulse 73  Ht 5\' 4"  (1.626 m)  Wt 180 lb (81.647 kg)  BMI 30.88 kg/m2  LMP 12/08/2013 General appearance: alert, cooperative and appears stated age Head: Normocephalic, without obvious abnormality, atraumatic Neck: no adenopathy, supple, symmetrical, trachea midline and thyroid not enlarged, symmetric, no tenderness/mass/nodules Lungs: clear to auscultation bilaterally Breasts: normal appearance, no masses or tenderness Heart: regular rate and rhythm, S1, S2 normal, no murmur, click, rub or gallop Abdomen: soft, non-tender; bowel sounds normal; no masses,  no organomegaly Pelvic: cervix normal in appearance, external genitalia normal, no adnexal masses or tenderness, no cervical motion tenderness, uterus normal size, shape, and  consistency and vagina normal without discharge Extremities: extremities normal, atraumatic, no cyanosis or edema Pulses: 2+ and symmetric Skin: Skin color, texture, turgor normal. No rashes or lesions Lymph nodes: Cervical, supraclavicular, and axillary nodes normal. Neurologic: Grossly normal    Assessment:    Healthy female exam.      Plan:     Screening for malignant neoplasm of the cervix - Plan: Cytology - PAP  Routine gynecological examination - Plan: Cytology - PAP, CBC, Comprehensive metabolic panel, TSH, Lipid panel, RPR, HIV antibody  Resume OC's--consider Copper T IUD, if necessary.  See After Visit Summary for Counseling Recommendations

## 2014-01-21 LAB — COMPREHENSIVE METABOLIC PANEL
ALK PHOS: 63 U/L (ref 39–117)
ALT: 9 U/L (ref 0–35)
AST: 14 U/L (ref 0–37)
Albumin: 4.3 g/dL (ref 3.5–5.2)
BILIRUBIN TOTAL: 0.3 mg/dL (ref 0.2–1.2)
BUN: 12 mg/dL (ref 6–23)
CALCIUM: 9.4 mg/dL (ref 8.4–10.5)
CHLORIDE: 102 meq/L (ref 96–112)
CO2: 25 mEq/L (ref 19–32)
Creat: 0.82 mg/dL (ref 0.50–1.10)
Glucose, Bld: 85 mg/dL (ref 70–99)
POTASSIUM: 3.8 meq/L (ref 3.5–5.3)
SODIUM: 136 meq/L (ref 135–145)
TOTAL PROTEIN: 7.1 g/dL (ref 6.0–8.3)

## 2014-01-21 LAB — HIV ANTIBODY (ROUTINE TESTING W REFLEX): HIV: NONREACTIVE

## 2014-01-21 LAB — RPR

## 2014-01-21 LAB — LIPID PANEL
CHOLESTEROL: 156 mg/dL (ref 0–200)
HDL: 38 mg/dL — ABNORMAL LOW (ref >39)
LDL CALC: 86 mg/dL (ref 0–99)
TRIGLYCERIDES: 159 mg/dL — AB (ref <150)
Total CHOL/HDL Ratio: 4.1 Ratio
VLDL: 32 mg/dL (ref 0–40)

## 2014-01-21 LAB — TSH: TSH: 1.74 u[IU]/mL (ref 0.350–4.500)

## 2014-08-25 ENCOUNTER — Encounter: Payer: Self-pay | Admitting: Family Medicine

## 2014-12-01 ENCOUNTER — Other Ambulatory Visit: Payer: Self-pay | Admitting: Obstetrics & Gynecology

## 2022-12-14 ENCOUNTER — Ambulatory Visit (INDEPENDENT_AMBULATORY_CARE_PROVIDER_SITE_OTHER): Payer: Medicaid Other | Admitting: Radiology

## 2022-12-14 ENCOUNTER — Encounter: Payer: Self-pay | Admitting: Radiology

## 2022-12-14 ENCOUNTER — Other Ambulatory Visit (HOSPITAL_COMMUNITY)
Admission: RE | Admit: 2022-12-14 | Discharge: 2022-12-14 | Disposition: A | Discharge status: Home / Self Care | Payer: Medicaid Other | Source: Ambulatory Visit | Attending: Radiology | Admitting: Radiology

## 2022-12-14 VITALS — BP 114/82 | HR 98 | Ht 64.25 in | Wt 242.0 lb

## 2022-12-14 DIAGNOSIS — N926 Irregular menstruation, unspecified: Secondary | ICD-10-CM

## 2022-12-14 DIAGNOSIS — Z01419 Encounter for gynecological examination (general) (routine) without abnormal findings: Secondary | ICD-10-CM

## 2022-12-14 DIAGNOSIS — Z862 Personal history of diseases of the blood and blood-forming organs and certain disorders involving the immune mechanism: Secondary | ICD-10-CM | POA: Diagnosis not present

## 2022-12-14 LAB — CBC
Hemoglobin: 7.3 g/dL — ABNORMAL LOW (ref 11.7–15.5)
RBC: 3.18 10*6/uL — ABNORMAL LOW (ref 3.80–5.10)
RDW: 14.4 % (ref 11.0–15.0)
WBC: 6.2 10*3/uL (ref 3.8–10.8)

## 2022-12-14 NOTE — Progress Notes (Signed)
[name] [name] Jul 14, 1981 [id number]   History:  42 y.o. G2P2 presents for annual exam.Has not seen a doctor in more than 5 years. Pt reports since increased stress started around 01/2022 when son was dx w/ epilepsy and then mother w/ cancer, cycles have become further apart and has even skipped months. Pt denies being SA since 2018 so hasn't been taking/using any BC since then.   Gynecologic History Patient's last menstrual period was 10/26/2022 (exact date).   Contraception/Family planning: abstinence Sexually active: no Last Pap: 2015. Results were: normal, history of abnormal paps   Obstetric History OB History  Gravida Para Term Preterm AB Living  2 2 2     2  $ SAB IAB Ectopic Multiple Live Births               # Outcome Date GA Lbr Len/2nd Weight Sex Delivery Anes PTL Lv  2 Term 01/26/08    Jerilynn Mages Vag-Spont     1 Term 09/18/06    M Vag-Spont        The following portions of the patient's history were reviewed and updated as appropriate: allergies, current medications, past family history, past medical history, past social history, past surgical history, and problem list.  Review of Systems Pertinent items noted in HPI and remainder of comprehensive ROS otherwise negative.   Past medical history, past surgical history, family history and social history were all reviewed and documented in the EPIC chart.   Exam:  Vitals:   12/14/22 1326  BP: 114/82  Pulse: 98  SpO2: 99%  Weight: 242 lb (109.8 kg)  Height: 5' 4.25" (1.632 m)   Body mass index is 41.22 kg/m.  General appearance:  Normal, obese Thyroid:  Symmetrical, normal in size, without palpable masses or nodularity. Respiratory  Auscultation:  Clear without wheezing or rhonchi Cardiovascular  Auscultation:  Regular rate, without rubs, murmurs or gallops  Edema/varicosities:  Not grossly evident Abdominal  Soft,nontender, without masses, guarding or rebound.  Liver/spleen:  No organomegaly noted  Hernia:   None appreciated  Skin  Inspection:  Grossly normal Breasts: Examined lying and sitting.   Right: Without masses, retractions, nipple discharge or axillary adenopathy.   Left: Without masses, retractions, nipple discharge or axillary adenopathy. Genitourinary   Inguinal/mons:  Normal without inguinal adenopathy  External genitalia:  Normal appearing vulva with no masses, tenderness, or lesions  BUS/Urethra/Skene's glands:  Normal without masses or exudate  Vagina:  Normal appearing with normal color and discharge, no lesions  Cervix:  Normal appearing without discharge or lesions  Uterus:  Normal in size, shape and contour.  Mobile, nontender  Adnexa/parametria:     Rt: Normal in size, without masses or tenderness.   Lt: Normal in size, without masses or tenderness.  Anus and perineum: Normal   Patient informed chaperone available to be present for breast and pelvic exam. Patient has requested no chaperone to be present. Patient has been advised what will be completed during breast and pelvic exam.   Assessment/Plan:   1. Well woman exam with routine gynecological exam - encouraged to schedule mammogram - Cytology - PAP( [place]) - Has appt to establish with PCP next month  2. Irregular menses - CBC - Comp Met (CMET) - Thyroid Panel With TSH - FSH - LH - Prolactin - Testos,Total,Free and SHBG (Female) - DHEA-sulfate  3. History of anemia - CBC     Discussed SBE, colonoscopy and DEXA screening as directed/appropriate. Recommend 186mns of exercise weekly, including  weight bearing exercise. Encouraged the use of seatbelts and sunscreen. Return in 1 year for annual or as needed.   Rubbie Battiest B WHNP-BC 1:52 PM 12/14/2022

## 2022-12-15 ENCOUNTER — Other Ambulatory Visit: Payer: Self-pay

## 2022-12-15 DIAGNOSIS — E039 Hypothyroidism, unspecified: Secondary | ICD-10-CM

## 2022-12-15 LAB — LUTEINIZING HORMONE: LH: 2.5 m[IU]/mL

## 2022-12-15 LAB — CYTOLOGY - PAP
Comment: NEGATIVE
Diagnosis: NEGATIVE
High risk HPV: NEGATIVE

## 2022-12-15 LAB — COMPREHENSIVE METABOLIC PANEL
ALT: 7 U/L (ref 6–29)
AST: 11 U/L (ref 10–30)
Alkaline phosphatase (APISO): 93 U/L (ref 31–125)
BUN: 9 mg/dL (ref 7–25)
Calcium: 8.7 mg/dL (ref 8.6–10.2)
Glucose, Bld: 100 mg/dL — ABNORMAL HIGH (ref 65–99)
Potassium: 4 mmol/L (ref 3.5–5.3)
Sodium: 138 mmol/L (ref 135–146)
Total Bilirubin: 0.3 mg/dL (ref 0.2–1.2)

## 2022-12-15 LAB — THYROID PANEL WITH TSH
T4, Total: 6 ug/dL (ref 5.1–11.9)
TSH: 10.81 mIU/L — ABNORMAL HIGH

## 2022-12-15 LAB — FOLLICLE STIMULATING HORMONE: FSH: 4.5 m[IU]/mL

## 2022-12-15 LAB — CBC
MCH: 23 pg — ABNORMAL LOW (ref 27.0–33.0)
MCHC: 30.4 g/dL — ABNORMAL LOW (ref 32.0–36.0)

## 2022-12-15 MED ORDER — LEVOTHYROXINE SODIUM 50 MCG PO TABS: 50.0000 ug | ORAL_TABLET | Freq: Every day | ORAL | 0 refills | Status: DC

## 2022-12-17 LAB — PROLACTIN: Prolactin: 11.3 ng/mL

## 2022-12-17 LAB — COMPREHENSIVE METABOLIC PANEL
AG Ratio: 1.3 (calc) (ref 1.0–2.5)
Albumin: 3.9 g/dL (ref 3.6–5.1)
CO2: 26 mmol/L (ref 20–32)
Chloride: 105 mmol/L (ref 98–110)
Creat: 0.76 mg/dL (ref 0.50–0.99)
Globulin: 3 g/dL (calc) (ref 1.9–3.7)
Total Protein: 6.9 g/dL (ref 6.1–8.1)

## 2022-12-17 LAB — TESTOS,TOTAL,FREE AND SHBG (FEMALE)
Free Testosterone: 1.6 pg/mL (ref 0.1–6.4)
Sex Hormone Binding: 81.3 nmol/L (ref 17–124)
Testosterone, Total, LC-MS-MS: 19 ng/dL (ref 2–45)

## 2022-12-17 LAB — CBC
HCT: 24 % — ABNORMAL LOW (ref 35.0–45.0)
MCV: 75.5 fL — ABNORMAL LOW (ref 80.0–100.0)
MPV: 10.2 fL (ref 7.5–12.5)
Platelets: 394 10*3/uL (ref 140–400)

## 2022-12-17 LAB — DHEA-SULFATE: DHEA-SO4: 122 ug/dL (ref 19–237)

## 2022-12-17 LAB — THYROID PANEL WITH TSH
Free Thyroxine Index: 1.4 (ref 1.4–3.8)
T3 Uptake: 23 % (ref 22–35)

## 2023-01-13 ENCOUNTER — Encounter: Payer: Self-pay | Admitting: Physician Assistant

## 2023-01-13 ENCOUNTER — Ambulatory Visit (INDEPENDENT_AMBULATORY_CARE_PROVIDER_SITE_OTHER): Payer: Medicaid Other | Admitting: Physician Assistant

## 2023-01-13 VITALS — BP 110/54 | HR 71 | Ht 64.0 in | Wt 240.4 lb

## 2023-01-13 DIAGNOSIS — D649 Anemia, unspecified: Secondary | ICD-10-CM

## 2023-01-13 DIAGNOSIS — E039 Hypothyroidism, unspecified: Secondary | ICD-10-CM | POA: Diagnosis not present

## 2023-01-13 DIAGNOSIS — N946 Dysmenorrhea, unspecified: Secondary | ICD-10-CM | POA: Diagnosis not present

## 2023-01-13 MED ORDER — NORETHINDRONE ACETATE 5 MG PO TABS: 5.0000 mg | ORAL_TABLET | Freq: Every day | ORAL | 0 refills | Status: DC

## 2023-01-13 NOTE — Patient Instructions (Signed)
Please contact [phone] to schedule your mammogram. You will be asked your location preference to have procedure performed. You have two options listed below.  1) [facility] located at [address] [place], [place] [address] 2) [facility] [place] located at [address] [place], [place] [address]  Upon results being received our office will contact you. As well as all results can be viewed through your MyChart. Please feel free to contact us if you have any further questions or concerns.   

## 2023-01-13 NOTE — Assessment & Plan Note (Addendum)
Given pt is still bleeding heavily, and hgb was 7.3, I would like to repeat cbc today, also adding iron panel. Concerns that she is still bleeding heavily and her h/h was so low. Pt does not want to start a traditional OCP I would consider, norethindrone, txa.  Message out to gyn for additional info/recommendations, for now rx norethindrone 5 mg daily

## 2023-01-13 NOTE — Progress Notes (Signed)
I,Sha'taria Tyson,acting as a Education administrator for [employer], PA-C.,have documented all relevant documentation on the behalf of Mikey Kirschner, PA-C,as directed by  Mikey Kirschner, PA-C while in the presence of Mikey Kirschner, PA-C.   New patient visit   Patient: [name] [name]   DOB: 1981-04-29   42 y.o. Female  MRN: [id number] Visit Date: 01/13/2023  Today's healthcare provider: Mikey Kirschner, PA-C   Cc. New patient, heavy menstrual cycle  Subjective    [name] [name] is a 42 y.o. female who presents today as a new patient to establish care.   She reports she was recently diagnosed with hypothyroidism from her PCP. She has started on medication. She also reports that starting in January she had a menstrual cycle that lasted 42 days and was extremely heavy. Reports needing multiple super tampons a day for 42 days straight. There was a short break and she started bleeding again 12/31/22 and has not stopped since.  She reports Nov/Dec having some atypical cycle-- skipping Nov and having a slightly longer one in Dec.  Pt reports she found out she was anemic at the GYN and started iron replacement. She has bw scheduled for f/u in May.   Past Medical History:  Diagnosis Date   Anemia    Thyroid disease    Past Surgical History:  Procedure Laterality Date   VAGINAL DELIVERY     two children   WISDOM TOOTH EXTRACTION     Family Status  Relation Name Status   Mother Hassan Rowan Deceased   Father Fritz Pickerel Deceased   Family History  Problem Relation Age of Onset   Lung cancer Mother 59   Fibroids Mother    Heart disease Father 1   Social History   Socioeconomic History   Marital status: Single    Spouse name: Not on file   Number of children: Not on file   Years of education: Not on file   Highest education level: Not on file  Occupational History   Not on file  Tobacco Use   Smoking status: Never   Smokeless tobacco: Never  Substance and Sexual Activity   Alcohol use: Not  Currently   Drug use: No   Sexual activity: Not Currently    Partners: Male    Birth control/protection: Abstinence    Comment: First IC >16, >5 Partners, Hx of CT+, HSV 2+  Other Topics Concern   Not on file  Social History Narrative   Not on file   Social Determinants of Health   Financial Resource Strain: Not on file  Food Insecurity: Not on file  Transportation Needs: Not on file  Physical Activity: Not on file  Stress: Not on file  Social Connections: Not on file   Outpatient Medications Prior to Visit  Medication Sig   Ascorbic Acid (VITAMIN C) 500 MG CAPS Take by mouth.   ferrous sulfate 324 MG TBEC Take 324 mg by mouth daily with breakfast.   levothyroxine (SYNTHROID) 50 MCG tablet Take 1 tablet (50 mcg total) by mouth daily.   No facility-administered medications prior to visit.   No Known Allergies  Immunization History  Administered Date(s) Administered   DTaP 02/03/1988   OPV 02/03/1988   Tdap 03/16/2017    Health Maintenance  Topic Date Due   COVID-19 Vaccine (1) Never done   MAMMOGRAM  Never done   Hepatitis C Screening  Never done   INFLUENZA VACCINE  Never done   PAP SMEAR-Modifier  12/14/2025   DTaP/Tdap/Td (  3 - Td or Tdap) 03/17/2027   HIV Screening  Completed   HPV VACCINES  Aged Out    Patient Care Team: Patient, No Pcp Per as PCP - General (General Practice) Chrzanowski, Annitta Needs, NP as Nurse Practitioner (Radiology)  Review of Systems  Constitutional:  Negative for fatigue and fever.  Respiratory:  Negative for cough and shortness of breath.   Cardiovascular:  Negative for chest pain and leg swelling.  Gastrointestinal:  Negative for abdominal pain.  Genitourinary:  Positive for menstrual problem.  Neurological:  Negative for dizziness and headaches.      Objective    BP (!) 110/54 (BP Location: Left Arm, Patient Position: Sitting, Cuff Size: Large)   Pulse 71   Ht 5\' 4"  (1.626 m)   Wt 240 lb 6.4 oz (109 kg)   LMP 10/26/2022  (Exact Date)   SpO2 100%   BMI 41.26 kg/m  Blood pressure (!) 110/54, pulse 71, height 5\' 4"  (1.626 m), weight 240 lb 6.4 oz (109 kg), last menstrual period 10/26/2022, SpO2 100 %.   Physical Exam Constitutional:      General: She is awake.     Appearance: She is well-developed.  HENT:     Head: Normocephalic.  Eyes:     Conjunctiva/sclera: Conjunctivae normal.  Cardiovascular:     Rate and Rhythm: Normal rate and regular rhythm.     Heart sounds: Normal heart sounds.  Pulmonary:     Effort: Pulmonary effort is normal.     Breath sounds: Normal breath sounds.  Skin:    General: Skin is warm.  Neurological:     Mental Status: She is alert and oriented to person, place, and time.  Psychiatric:        Attention and Perception: Attention normal.        Mood and Affect: Mood normal.        Speech: Speech normal.        Behavior: Behavior is cooperative.    Depression Screen    01/13/2023    2:18 PM  PHQ 2/9 Scores  PHQ - 2 Score 0  PHQ- 9 Score 0   No results found for any visits on 01/13/23.  Assessment & Plan      Problem List Items Addressed This Visit       Endocrine   Hypothyroidism - Primary    Pt managed on levothyroxine 50 mcg Agree with repeat in May, either gyn or myself can adjust/rx        Genitourinary   Dysmenorrhea    Given pt is still bleeding heavily, and hgb was 7.3, I would like to repeat cbc today, also adding iron panel. Concerns that she is still bleeding heavily and her h/h was so low. Pt does not want to start a traditional OCP I would consider, norethindrone, txa.  Message out to gyn for additional info/recommendations, for now rx norethindrone 5 mg daily         Relevant Medications   norethindrone (AYGESTIN) 5 MG tablet     Other   Anemia    Pt taking iron replacement Given low h/h , repeat cbc/ iron panel today If hgb < 7 would ref to heme      Relevant Medications   ferrous sulfate 324 MG TBEC   Other Relevant Orders    CBC w/Diff/Platelet   Iron, TIBC and Ferritin Panel     Return if symptoms worsen or fail to improve.     I, Mikey Kirschner,  PA-C have reviewed all documentation for this visit. The documentation on  01/13/23  for the exam, diagnosis, procedures, and orders are all accurate and complete.  Mikey Kirschner, PA-C [facility] [address] [place], [place], [address] Office: [phone] Fax: [place]

## 2023-01-13 NOTE — Assessment & Plan Note (Signed)
Pt managed on levothyroxine 50 mcg Agree with repeat in May, either gyn or myself can adjust/rx

## 2023-01-13 NOTE — Assessment & Plan Note (Signed)
Pt taking iron replacement Given low h/h , repeat cbc/ iron panel today If hgb < 7 would ref to heme

## 2023-01-14 LAB — IRON,TIBC AND FERRITIN PANEL
Ferritin: 52 ng/mL (ref 15–150)
Iron Saturation: 7 % — CL (ref 15–55)
Iron: 26 ug/dL — ABNORMAL LOW (ref 27–159)
Total Iron Binding Capacity: 353 ug/dL (ref 250–450)
UIBC: 327 ug/dL (ref 131–425)

## 2023-01-14 LAB — CBC WITH DIFFERENTIAL/PLATELET
Basophils Absolute: 0 10*3/uL (ref 0.0–0.2)
Basos: 1 %
EOS (ABSOLUTE): 0.2 10*3/uL (ref 0.0–0.4)
Eos: 3 %
Hematocrit: 31.1 % — ABNORMAL LOW (ref 34.0–46.6)
Hemoglobin: 9.4 g/dL — ABNORMAL LOW (ref 11.1–15.9)
Immature Grans (Abs): 0 10*3/uL (ref 0.0–0.1)
Immature Granulocytes: 0 %
Lymphocytes Absolute: 1.9 10*3/uL (ref 0.7–3.1)
Lymphs: 32 %
MCH: 25.9 pg — ABNORMAL LOW (ref 26.6–33.0)
MCHC: 30.2 g/dL — ABNORMAL LOW (ref 31.5–35.7)
MCV: 86 fL (ref 79–97)
Monocytes Absolute: 0.5 10*3/uL (ref 0.1–0.9)
Monocytes: 9 %
Neutrophils Absolute: 3.4 10*3/uL (ref 1.4–7.0)
Neutrophils: 55 %
Platelets: 376 10*3/uL (ref 150–450)
RBC: 3.63 x10E6/uL — ABNORMAL LOW (ref 3.77–5.28)
RDW: 20.3 % — ABNORMAL HIGH (ref 11.7–15.4)
WBC: 6.1 10*3/uL (ref 3.4–10.8)

## 2023-02-09 ENCOUNTER — Other Ambulatory Visit: Payer: Self-pay | Admitting: Radiology

## 2023-02-09 MED ORDER — SLYND 4 MG PO TABS: 1.0000 | ORAL_TABLET | Freq: Every day | ORAL | 0 refills | Status: DC

## 2023-02-09 NOTE — Telephone Encounter (Signed)
Called patient to schedule OV, no answer.   Routing MyChart message to [place] to review.

## 2023-02-09 NOTE — Telephone Encounter (Signed)
Spoke with patient, advised per [facility],  Patient states she is still taking iron and is agreeable to new RX. Patient aware to f/u with pharmacy for filling, will provide update on medication and symptoms. Questions answered. Patient verbalizes understanding and agreeable.   Slynd Rx pended, pharmacy updated. Jami, please review and sign.  Ok to close encounter once Rx sent.

## 2023-02-09 NOTE — Telephone Encounter (Signed)
I would recommend switching her to [facility] (send to my scripts) to take daily to manage her bleeding and thin the lining. She can continue the Aygestin until the [place] arrives.Please confirm she is taking her iron still.

## 2023-02-13 ENCOUNTER — Encounter: Payer: Self-pay | Admitting: Obstetrics and Gynecology

## 2023-02-13 ENCOUNTER — Ambulatory Visit: Payer: Medicaid Other | Admitting: Obstetrics and Gynecology

## 2023-02-13 VITALS — BP 118/78 | HR 87 | Wt 239.0 lb

## 2023-02-13 DIAGNOSIS — N921 Excessive and frequent menstruation with irregular cycle: Secondary | ICD-10-CM | POA: Diagnosis not present

## 2023-02-13 DIAGNOSIS — D649 Anemia, unspecified: Secondary | ICD-10-CM | POA: Diagnosis not present

## 2023-02-13 LAB — CBC
HCT: 30.1 % — ABNORMAL LOW (ref 35.0–45.0)
Hemoglobin: 9.6 g/dL — ABNORMAL LOW (ref 11.7–15.5)
MCH: 27 pg (ref 27.0–33.0)
MCHC: 31.9 g/dL — ABNORMAL LOW (ref 32.0–36.0)
MCV: 84.8 fL (ref 80.0–100.0)
MPV: 10 fL (ref 7.5–12.5)
Platelets: 412 10*3/uL — ABNORMAL HIGH (ref 140–400)
RBC: 3.55 10*6/uL — ABNORMAL LOW (ref 3.80–5.10)
RDW: 15.4 % — ABNORMAL HIGH (ref 11.0–15.0)
WBC: 9.5 10*3/uL (ref 3.8–10.8)

## 2023-02-13 MED ORDER — NORETHINDRONE ACETATE 5 MG PO TABS: 5.0000 mg | ORAL_TABLET | Freq: Two times a day (BID) | ORAL | 1 refills | Status: DC

## 2023-02-13 NOTE — Progress Notes (Signed)
GYNECOLOGY  VISIT   HPI: 42 y.o.   Single  Caucasian  female   G2P2002 with Patient's last menstrual period was 02/04/2023.   here for   excessive bleeding. Pt has to change tampons every hour. Only slows after iron sample.  Last year would skip her period a couple of times and then have a cycle for 2 weeks but no excessive bleeding.   In January, 2024, started bleeding and clotting that lasted for 42 days.   Seen in this office on 12/14/22 for a well woman visit.  TSH 10.81 and Hgb 7.3 on 12/14/22.  She was started on levothyroxine and iron supplementation.   Her bleeding started again around 12/30/22 and was heavy.   She was seen by her PCP on 01/13/23 and was started on Aygestin 5 mg daily.  Hgb 9.4. Bleeding stopped on 01/16/23.    Bleeding resumed again on 02/04/23 and continues.  She changes a pad about every hour.  Stopped Aygestin on 02/09/23 and started Slynd on 02/10/23.   Not feeling much dizziness or lightheaded.   Not sexually active.  Does not need contraception.   Denies HTN, smoke, migraine with aura, liver or breast disease, personal hx of thromboembolic events.   Took combined contraception in the past and no thromboembolic events.   GYNECOLOGIC HISTORY: Patient's last menstrual period was 02/04/2023. Contraception:  OCP Menopausal hormone therapy:  n/a Last mammogram:  n/a Last pap smear:   12/14/22 neg: HR HPV neg        OB History     Gravida  2   Para  2   Term  2   Preterm      AB      Living  2      SAB      IAB      Ectopic      Multiple      Live Births                 Patient Active Problem List   Diagnosis Date Noted   Dysmenorrhea 01/13/2023   Hypothyroidism 01/13/2023   Anemia 01/13/2023    Past Medical History:  Diagnosis Date   Anemia    Thyroid disease     Past Surgical History:  Procedure Laterality Date   VAGINAL DELIVERY     two children   WISDOM TOOTH EXTRACTION      Current Outpatient Medications   Medication Sig Dispense Refill   Ascorbic Acid (VITAMIN C) 500 MG CAPS Take by mouth.     Drospirenone (SLYND) 4 MG TABS Take 1 tablet (4 mg total) by mouth daily. 84 tablet 0   ferrous sulfate 324 MG TBEC Take 324 mg by mouth daily with breakfast.     levothyroxine (SYNTHROID) 50 MCG tablet Take 1 tablet (50 mcg total) by mouth daily. 90 tablet 0   norethindrone (AYGESTIN) 5 MG tablet Take 1 tablet (5 mg total) by mouth daily. 90 tablet 0   No current facility-administered medications for this visit.     ALLERGIES: Patient has no known allergies.  Family History  Problem Relation Age of Onset   Lung cancer Mother 42   Fibroids Mother    Heart disease Father 74    Social History   Socioeconomic History   Marital status: Single    Spouse name: Not on file   Number of children: Not on file   Years of education: Not on file   Highest education level: Not  on file  Occupational History   Not on file  Tobacco Use   Smoking status: Never   Smokeless tobacco: Never  Substance and Sexual Activity   Alcohol use: Not Currently   Drug use: No   Sexual activity: Not Currently    Partners: Male    Birth control/protection: Abstinence    Comment: First IC >16, >5 Partners, Hx of CT+, HSV 2+  Other Topics Concern   Not on file  Social History Narrative   Not on file   Social Determinants of Health   Financial Resource Strain: Not on file  Food Insecurity: Not on file  Transportation Needs: Not on file  Physical Activity: Not on file  Stress: Not on file  Social Connections: Not on file  Intimate Partner Violence: Not on file    Review of Systems  Genitourinary:  Positive for vaginal bleeding.  All other systems reviewed and are negative.   PHYSICAL EXAMINATION:    BP 118/78 (BP Location: Right Arm, Patient Position: Sitting, Cuff Size: Large)   Pulse 87   Wt 239 lb (108.4 kg)   LMP 02/04/2023   SpO2 100%   BMI 41.02 kg/m     General appearance: alert,  cooperative and appears stated age   Pelvic: External genitalia:  no lesions              Urethra:  normal appearing urethra with no masses, tenderness or lesions              Bartholins and Skenes: normal                 Vagina: normal appearing vagina with normal color and discharge, no lesions              Cervix: no lesions.  Clot from the os.                 Bimanual Exam:  Uterus:  normal size, contour, position, consistency, mobility, non-tender              Adnexa: no mass, fullness, tenderness               Chaperone was present for exam:  Joy J, CMA  ASSESSMENT  Menorrhagia with irregular menses.   Anemia.  Hypothyroidism.   PLAN  Stop Slynd.  Start Aygestin 5 mg po bid.  #60, RF 1.  Check CBC.  Return for pelvic ultrasound and possible endometrial biopsy with me. Call if no improvement in bleeding in 48 hours.   30 min  total time was spent for this patient encounter, including preparation, face-to-face counseling with the patient, coordination of care, and documentation of the encounter.

## 2023-02-13 NOTE — Telephone Encounter (Signed)
Routing to covering provider to review.   Dr. Edward Jolly -please review and advise.

## 2023-02-13 NOTE — Telephone Encounter (Signed)
Call to patient, Left message to call Noreene Larsson, RN at [facility], [phone], option 4.

## 2023-02-13 NOTE — Telephone Encounter (Signed)
Scheduled for OV today with Dr. Edward Jolly.   Routing to provider for final review. Patient is agreeable to disposition. Will close encounter.   Cc; Clearnce Hasten

## 2023-03-07 ENCOUNTER — Other Ambulatory Visit: Payer: Self-pay | Admitting: Radiology

## 2023-03-07 DIAGNOSIS — E039 Hypothyroidism, unspecified: Secondary | ICD-10-CM

## 2023-03-08 NOTE — Telephone Encounter (Signed)
Patient has lab apt. Scheduled 03/10/23. RF approved.

## 2023-03-10 ENCOUNTER — Other Ambulatory Visit: Payer: Self-pay | Admitting: *Deleted

## 2023-03-10 ENCOUNTER — Other Ambulatory Visit: Payer: Self-pay | Admitting: Obstetrics and Gynecology

## 2023-03-10 ENCOUNTER — Other Ambulatory Visit: Payer: Medicaid Other

## 2023-03-10 DIAGNOSIS — D649 Anemia, unspecified: Secondary | ICD-10-CM

## 2023-03-10 DIAGNOSIS — N921 Excessive and frequent menstruation with irregular cycle: Secondary | ICD-10-CM

## 2023-03-10 DIAGNOSIS — E039 Hypothyroidism, unspecified: Secondary | ICD-10-CM

## 2023-03-10 LAB — CBC
MCV: 89.1 fL (ref 80.0–100.0)
RBC: 4.41 10*6/uL (ref 3.80–5.10)
WBC: 7.6 10*3/uL (ref 3.8–10.8)

## 2023-03-10 NOTE — Telephone Encounter (Signed)
Med refill request: norethindrone 5 mg tab PO BID Last AEX: 12/14/22 -BS Next AEX: Not scheduled  PUS 04/25/23  Last MMG (if hormonal med) None  Rx sent 02/13/23 for #60/1RF  Rx refused. Should have 1 refill remaining.   Routing to provider for final review.  Encounter closed.

## 2023-03-11 ENCOUNTER — Other Ambulatory Visit: Payer: Self-pay | Admitting: Obstetrics and Gynecology

## 2023-03-11 DIAGNOSIS — D649 Anemia, unspecified: Secondary | ICD-10-CM

## 2023-03-11 DIAGNOSIS — E039 Hypothyroidism, unspecified: Secondary | ICD-10-CM

## 2023-03-11 LAB — CBC
HCT: 39.3 % (ref 35.0–45.0)
Hemoglobin: 12.5 g/dL (ref 11.7–15.5)
MCH: 28.3 pg (ref 27.0–33.0)
MCHC: 31.8 g/dL — ABNORMAL LOW (ref 32.0–36.0)
MPV: 10.5 fL (ref 7.5–12.5)
Platelets: 419 10*3/uL — ABNORMAL HIGH (ref 140–400)
RDW: 14.4 % (ref 11.0–15.0)

## 2023-03-11 LAB — THYROID PANEL WITH TSH
Free Thyroxine Index: 1.9 (ref 1.4–3.8)
T3 Uptake: 30 % (ref 22–35)
T4, Total: 6.2 ug/dL (ref 5.1–11.9)
TSH: 5.97 mIU/L — ABNORMAL HIGH

## 2023-03-11 MED ORDER — LEVOTHYROXINE SODIUM 75 MCG PO TABS: 75.0000 ug | ORAL_TABLET | Freq: Every day | ORAL | 0 refills | Status: DC

## 2023-04-05 ENCOUNTER — Other Ambulatory Visit: Payer: Self-pay | Admitting: Physician Assistant

## 2023-04-05 DIAGNOSIS — N921 Excessive and frequent menstruation with irregular cycle: Secondary | ICD-10-CM

## 2023-04-25 ENCOUNTER — Other Ambulatory Visit: Payer: Medicaid Other

## 2023-04-25 ENCOUNTER — Other Ambulatory Visit: Payer: Medicaid Other | Admitting: Obstetrics and Gynecology

## 2023-04-25 ENCOUNTER — Ambulatory Visit: Payer: Medicaid Other

## 2023-04-25 DIAGNOSIS — N921 Excessive and frequent menstruation with irregular cycle: Secondary | ICD-10-CM

## 2023-05-06 ENCOUNTER — Other Ambulatory Visit: Payer: Self-pay | Admitting: Obstetrics and Gynecology

## 2023-05-06 DIAGNOSIS — N921 Excessive and frequent menstruation with irregular cycle: Secondary | ICD-10-CM

## 2023-05-08 NOTE — Telephone Encounter (Signed)
Med refill request: Aygestin Last AEX: 12/14/22 Next AEX: not scheduled Last MMG (if hormonal med) n/a Refill authorized: Please Advise, #180, 1 RF

## 2023-05-12 ENCOUNTER — Telehealth: Payer: Self-pay | Admitting: Obstetrics and Gynecology

## 2023-05-12 NOTE — Telephone Encounter (Addendum)
Please contact patient in follow up to her pelvic US done in office 04/25/23.  She had the ultrasound for menorrhagia with irregular menses and anemia.   Her ultrasound shows her uterus has a heart shape to the internal cavity.  She has a tiny cyst near near left ovary.  She has a nabothian cyst on her cervix.  None of these findings are expected to contribute to heavy bleeding.   Please schedule a follow up appointment with me.  I expect she has or will run out of Aygestin soon.  She may still need an endometrial biopsy done.  It would be good to recheck her blood counts as well.

## 2023-05-15 NOTE — Telephone Encounter (Signed)
Mychart msg sent informing pt.  °

## 2023-05-16 NOTE — Telephone Encounter (Signed)
Msg sent to appt desk.  

## 2023-05-17 NOTE — Telephone Encounter (Signed)
Pt scheduled for 05/25/2023. Will route to provider for final review and close.

## 2023-05-23 NOTE — Progress Notes (Unsigned)
GYNECOLOGY  VISIT   HPI: 42 y.o.   Single  Caucasian  female   G2P2002 with Patient's last menstrual period was 04/25/2023 (approximate).   here for an endometrial biopsy for menorrhagia w/ irregular menses and anemia. Last CBC checked 03/10/2023.  CBC 12.5.  Her TSH was elevated to 5.97 on 03/10/23, so her Synthroid was increased to 75 mcg daily.   Her ultrasound 04/25/23 shows her uterus has a heart shape to the internal cavity.  She has a tiny cyst near near left ovary.  She has a nabothian cyst on her cervix.  None of these findings are expected to contribute to heavy bleeding.   Patient is currently managed on Aygestin 5 mg po bid.  Took a couple of weeks for her bleeding to stop.  Started spotting randomly and then some increase after the ultrasound was done.  No bleeding today.  No pain.  Has not run out of Aygestin.  Had problems with bleeding with Mirena IUD. Nexplanon also irregular bleeding.   Declines future childbearing.   UPT negative today.  GYNECOLOGIC HISTORY: Patient's last menstrual period was 04/25/2023 (approximate). Contraception: abstinence Menopausal hormone therapy: none Last mammogram: none Last pap smear: 12/14/2022-WNL, HPV- neg, 01/20/2014-WNL, HPV- neg        OB History     Gravida  2   Para  2   Term  2   Preterm      AB      Living  2      SAB      IAB      Ectopic      Multiple      Live Births                 Patient Active Problem List   Diagnosis Date Noted   Dysmenorrhea 01/13/2023   Hypothyroidism 01/13/2023   Anemia 01/13/2023    Past Medical History:  Diagnosis Date   Anemia    Thyroid disease     Past Surgical History:  Procedure Laterality Date   VAGINAL DELIVERY     two children   WISDOM TOOTH EXTRACTION      Current Outpatient Medications  Medication Sig Dispense Refill   Ascorbic Acid (VITAMIN C) 500 MG CAPS Take by mouth.     ferrous sulfate 324 MG TBEC Take 324 mg by mouth daily with  breakfast.     levothyroxine (SYNTHROID) 75 MCG tablet Take 1 tablet (75 mcg total) by mouth daily. 90 tablet 0   norethindrone (AYGESTIN) 5 MG tablet TAKE 1 TABLET (5 MG TOTAL) BY MOUTH IN THE MORNING AND AT BEDTIME 180 tablet 1   No current facility-administered medications for this visit.     ALLERGIES: Patient has no known allergies.  Family History  Problem Relation Age of Onset   Lung cancer Mother 101   Fibroids Mother    Heart disease Father 77    Social History   Socioeconomic History   Marital status: Single    Spouse name: Not on file   Number of children: Not on file   Years of education: Not on file   Highest education level: Not on file  Occupational History   Not on file  Tobacco Use   Smoking status: Never   Smokeless tobacco: Never  Substance and Sexual Activity   Alcohol use: Not Currently   Drug use: No   Sexual activity: Not Currently    Partners: Male    Birth control/protection: Abstinence  Comment: First IC >16, >5 Partners, Hx of CT+, HSV 2+  Other Topics Concern   Not on file  Social History Narrative   Not on file   Social Determinants of Health   Financial Resource Strain: Not on file  Food Insecurity: Not on file  Transportation Needs: Not on file  Physical Activity: Not on file  Stress: Not on file  Social Connections: Not on file  Intimate Partner Violence: Not on file    Review of Systems  All other systems reviewed and are negative.   PHYSICAL EXAMINATION:    BP 118/72   Pulse 78   LMP 04/25/2023 (Approximate)   SpO2 98%     General appearance: alert, cooperative and appears stated age  EMB Consent done.  Cervix and vaginal normal.  EMB to 10 cm x 2 after Hibiclens prep.  Tissue to pathology. No paracervical block.  No complications.  Minimal EBL.  Chaperone was present for exam:  Rosette Reveal, CMA  ASSESSMENT  Menorrhagia with irregular menses.  Hypothyroidism.  Hx anemia.   PLAN  Pelvic US images and  report reviewed.  FU EMB. Precautions given.  Wean off Aygestin.  Take one pill, 5 mg, by mouth daily for 1 month, and then stop. She wants to do observational management.  If she develops heavy or irregular cycles, will consider Seasonale, already discussed with patient.  Warning signs and potential risks of thromobembolic events reviewed with patient.  She has no contraindications.  Will do follow up labs today for TFTs, CBC, iron, ferritin.  FU prn and for annual exam Feb. 2025.   30 min  total time was spent for this patient encounter, including preparation, face-to-face counseling with the patient, coordination of care, and documentation of the encounter in addition to doing the endometrial biopsy.

## 2023-05-25 ENCOUNTER — Encounter: Payer: Self-pay | Admitting: Obstetrics and Gynecology

## 2023-05-25 ENCOUNTER — Ambulatory Visit: Payer: Medicaid Other | Admitting: Obstetrics and Gynecology

## 2023-05-25 ENCOUNTER — Other Ambulatory Visit (HOSPITAL_COMMUNITY)
Admission: RE | Admit: 2023-05-25 | Discharge: 2023-05-25 | Disposition: A | Discharge status: Home / Self Care | Payer: Medicaid Other | Source: Ambulatory Visit | Attending: Obstetrics and Gynecology | Admitting: Obstetrics and Gynecology

## 2023-05-25 VITALS — BP 118/72 | HR 78

## 2023-05-25 DIAGNOSIS — N921 Excessive and frequent menstruation with irregular cycle: Secondary | ICD-10-CM | POA: Insufficient documentation

## 2023-05-25 DIAGNOSIS — E039 Hypothyroidism, unspecified: Secondary | ICD-10-CM

## 2023-05-25 DIAGNOSIS — Z01812 Encounter for preprocedural laboratory examination: Secondary | ICD-10-CM

## 2023-05-25 DIAGNOSIS — D649 Anemia, unspecified: Secondary | ICD-10-CM | POA: Diagnosis not present

## 2023-05-25 LAB — PREGNANCY, URINE: Preg Test, Ur: NEGATIVE

## 2023-05-25 LAB — CBC
HCT: 47.3 % — ABNORMAL HIGH (ref 35.0–45.0)
Hemoglobin: 15.2 g/dL (ref 11.7–15.5)
MCH: 27.6 pg (ref 27.0–33.0)
MCHC: 32.1 g/dL (ref 32.0–36.0)
MCV: 85.8 fL (ref 80.0–100.0)
MPV: 9.8 fL (ref 7.5–12.5)
Platelets: 386 10*3/uL (ref 140–400)
RBC: 5.51 10*6/uL — ABNORMAL HIGH (ref 3.80–5.10)
RDW: 14 % (ref 11.0–15.0)
WBC: 8.1 10*3/uL (ref 3.8–10.8)

## 2023-05-27 MED ORDER — LEVOTHYROXINE SODIUM 75 MCG PO TABS
75.0000 ug | ORAL_TABLET | Freq: Every day | ORAL | 1 refills | Status: DC
Start: 2023-05-27 — End: 2023-12-04

## 2023-05-27 NOTE — Addendum Note (Signed)
Addended by: Ardell Isaacs, Debbe Bales E on: 05/27/2023 09:15 AM   Modules accepted: Orders

## 2023-05-29 MED ORDER — PROPOFOL 10 MG/ML IV BOLUS
INTRAVENOUS | Status: AC
  Filled 2023-05-29: qty 20

## 2023-05-29 MED ORDER — FENTANYL CITRATE (PF) 250 MCG/5ML IJ SOLN
INTRAMUSCULAR | Status: AC
  Filled 2023-05-29: qty 5

## 2023-05-29 MED ORDER — ROCURONIUM BROMIDE 10 MG/ML (PF) SYRINGE
PREFILLED_SYRINGE | INTRAVENOUS | Status: AC
  Filled 2023-05-29: qty 10

## 2023-05-29 MED ORDER — ONDANSETRON HCL 4 MG/2ML IJ SOLN
INTRAMUSCULAR | Status: AC
  Filled 2023-05-29: qty 2

## 2023-05-29 MED ORDER — LIDOCAINE 2% (20 MG/ML) 5 ML SYRINGE
INTRAMUSCULAR | Status: AC
  Filled 2023-05-29: qty 5

## 2023-05-29 MED ORDER — DEXAMETHASONE SODIUM PHOSPHATE 10 MG/ML IJ SOLN
INTRAMUSCULAR | Status: AC
  Filled 2023-05-29: qty 1

## 2023-05-29 MED ORDER — MIDAZOLAM HCL 2 MG/2ML IJ SOLN
INTRAMUSCULAR | Status: AC
  Filled 2023-05-29: qty 2

## 2023-05-30 ENCOUNTER — Encounter: Payer: Self-pay | Admitting: Obstetrics and Gynecology

## 2023-05-31 ENCOUNTER — Other Ambulatory Visit: Payer: Self-pay | Admitting: Obstetrics and Gynecology

## 2023-05-31 MED ORDER — LEVONORGEST-ETH ESTRAD 91-DAY 0.15-0.03 MG PO TABS: 1.0000 | ORAL_TABLET | Freq: Every day | ORAL | 0 refills | Status: DC

## 2023-05-31 NOTE — Progress Notes (Signed)
Stop Aygestin.  Start Seasonale.

## 2023-08-13 ENCOUNTER — Other Ambulatory Visit: Payer: Self-pay | Admitting: Obstetrics and Gynecology

## 2023-08-14 NOTE — Telephone Encounter (Signed)
Medication refill request: seasonale  Last AEX:  12/14/22 Next AEX: na Last MMG (if hormonal medication request): n/a Refill authorized: #91 pended for today

## 2023-11-14 ENCOUNTER — Other Ambulatory Visit: Payer: Self-pay | Admitting: Obstetrics and Gynecology

## 2023-11-15 NOTE — Telephone Encounter (Signed)
Med refill request: BCPs Last AEX: 12/14/2022-JC Next AEX: none, recall placed for 11/2023 Last MMG (if hormonal med): none/never per EMR investigation.  Refill authorized: rx pend.   Msg sent to appt desk to schedule AEX.

## 2023-11-16 NOTE — Telephone Encounter (Signed)
Pt now scheduled for AEX on 03/12/2024

## 2023-12-03 ENCOUNTER — Other Ambulatory Visit: Payer: Self-pay | Admitting: Obstetrics and Gynecology

## 2023-12-03 DIAGNOSIS — E039 Hypothyroidism, unspecified: Secondary | ICD-10-CM

## 2023-12-04 NOTE — Telephone Encounter (Signed)
 Medication refill request: levothyroxine   Last OV:  05/25/23  Next AEX: 03/12/24 Last MMG (if hormonal medication request): n/a Refill authorized: please advise

## 2023-12-28 ENCOUNTER — Ambulatory Visit: Payer: Medicaid Other | Admitting: Radiology

## 2024-03-08 NOTE — Progress Notes (Signed)
 43 y.o. G20P2002 Single Caucasian female here for annual exam.    Taking Seasonale birth control pills.  Has light cycles at the end of 3 months.  Not having cramping.  Headache at the end of the pack of pills.  Advil will treat it.  It is manageable.   Not sexually active.  Hx of iron deficiency anemia.  She is off her iron.   Notes new onset of some speech stuttering. Wonders if it is related to her thyroid  function.   Has life stress.  Son with autism.  Decreased support system since she lost her parents.   PCP: Trenton Frock, PA-C   Patient's last menstrual period was 03/02/2024 (exact date).     Period Cycle (Days):  (Every 3 months) Period Duration (Days): 2 Menstrual Flow: Light Menstrual Control: Tampon, Maxi pad Dysmenorrhea: None     Sexually active: No.  The current method of family planning is abstinence.    Menopausal hormone therapy:  n/a Exercising: Yes.    Walking Smoker:  no  OB History  Gravida Para Term Preterm AB Living  2 2 2   2   SAB IAB Ectopic Multiple Live Births          # Outcome Date GA Lbr Len/2nd Weight Sex Type Anes PTL Lv  2 Term 01/26/08    M Vag-Spont     1 Term 09/18/06    M Vag-Spont        HEALTH MAINTENANCE: Last 2 paps:  12/14/22 neg HR HPV neg, 01/20/14 neg  History of abnormal Pap or positive HPV:  no Mammogram:   n/a Colonoscopy:  n/a Bone Density:  n/a  Result  n/a   Immunization History  Administered Date(s) Administered   DTaP 02/03/1988   OPV 02/03/1988   Tdap 03/16/2017      reports that she has never smoked. She has never used smokeless tobacco. She reports that she does not currently use alcohol. She reports that she does not use drugs.  Past Medical History:  Diagnosis Date   Anemia    Thyroid  disease     Past Surgical History:  Procedure Laterality Date   VAGINAL DELIVERY     two children   WISDOM TOOTH EXTRACTION      Current Outpatient Medications  Medication Sig Dispense Refill    Levonorgest-Eth Est & Eth Est 42-21-21-7 DAYS TABS TAKE 1 TABLET BY MOUTH EVERY DAY 84 tablet 1   levothyroxine  (SYNTHROID ) 75 MCG tablet TAKE 1 TABLET BY MOUTH EVERY DAY 90 tablet 1   Ascorbic Acid (VITAMIN C) 500 MG CAPS Take by mouth. (Patient not taking: Reported on 03/12/2024)     ferrous sulfate 324 MG TBEC Take 324 mg by mouth daily with breakfast. (Patient not taking: Reported on 03/12/2024)     No current facility-administered medications for this visit.    ALLERGIES: Patient has no known allergies.  Family History  Problem Relation Age of Onset   Lung cancer Mother 47   Fibroids Mother    Heart disease Father 86    Review of Systems  All other systems reviewed and are negative.   PHYSICAL EXAM:  BP 114/76 (BP Location: Left Arm, Patient Position: Sitting)   Pulse 80   Ht 5' 5.75" (1.67 m)   Wt 239 lb (108.4 kg)   LMP 03/02/2024 (Exact Date)   SpO2 94%   BMI 38.87 kg/m     General appearance: alert, cooperative and appears stated age Head: normocephalic, without obvious  abnormality, atraumatic Neck: no adenopathy, supple, symmetrical, trachea midline and thyroid  normal to inspection and palpation Lungs: clear to auscultation bilaterally Breasts: normal appearance, no masses or tenderness, No nipple retraction or dimpling, No nipple discharge or bleeding, No axillary adenopathy Heart: regular rate and rhythm Abdomen: soft, non-tender; no masses, no organomegaly Extremities: extremities normal, atraumatic, no cyanosis or edema Skin: skin color, texture, turgor normal. No rashes or lesions Lymph nodes: cervical, supraclavicular, and axillary nodes normal. Neurologic: grossly normal  Pelvic: External genitalia:  no lesions              No abnormal inguinal nodes palpated.              Urethra:  normal appearing urethra with no masses, tenderness or lesions              Bartholins and Skenes: normal                 Vagina: normal appearing vagina with normal color and  discharge, no lesions              Cervix: no lesions              Pap taken: no Bimanual Exam:  Uterus:  normal size, contour, position, consistency, mobility, non-tender              Adnexa: no mass, fullness, tenderness              Rectal exam: yes.  Confirms.              Anus:  normal sphincter tone, no lesions  Chaperone was present for exam:  Nelva Bang, RN  ASSESSMENT: Well woman visit with gynecologic exam. Surveillance of COCs.  Menstrual headaches.  Arcuate shaped uterus.  Breast cancer screening.  Hypothyroidism.  Hx anemia. Routine labs.  PHQ-9: O Life stress.   PLAN: Mammogram screening discussed.  Ordered at [facility].  She will call to schedule.  Self breast awareness reviewed. Pap and HRV collected:  no.  Due in 2029.  Guidelines for Calcium, Vitamin D, regular exercise program including cardiovascular and weight bearing exercise. Medication refills:  Seasonale for one year.  Synthroid  for one year.  She declines taking her birth control pills continuously or adding a transdermal estradiol  patch during menstrual week to avoid menstrual headaches.   Chol, CMP, CBC, iron, ferritin, TSH, free T4. Brochure to patient to consider potential counseling through [employer].  Follow up:  yearly and prn.

## 2024-03-12 ENCOUNTER — Encounter: Payer: Self-pay | Admitting: Obstetrics and Gynecology

## 2024-03-12 ENCOUNTER — Ambulatory Visit (INDEPENDENT_AMBULATORY_CARE_PROVIDER_SITE_OTHER): Payer: Medicaid Other | Admitting: Obstetrics and Gynecology

## 2024-03-12 VITALS — BP 114/76 | HR 80 | Ht 65.75 in | Wt 239.0 lb

## 2024-03-12 DIAGNOSIS — Z01419 Encounter for gynecological examination (general) (routine) without abnormal findings: Secondary | ICD-10-CM | POA: Diagnosis not present

## 2024-03-12 DIAGNOSIS — Z1331 Encounter for screening for depression: Secondary | ICD-10-CM | POA: Diagnosis not present

## 2024-03-12 DIAGNOSIS — Z1231 Encounter for screening mammogram for malignant neoplasm of breast: Secondary | ICD-10-CM

## 2024-03-12 DIAGNOSIS — E039 Hypothyroidism, unspecified: Secondary | ICD-10-CM | POA: Diagnosis not present

## 2024-03-12 DIAGNOSIS — Z862 Personal history of diseases of the blood and blood-forming organs and certain disorders involving the immune mechanism: Secondary | ICD-10-CM

## 2024-03-12 DIAGNOSIS — Z0000 Encounter for general adult medical examination without abnormal findings: Secondary | ICD-10-CM

## 2024-03-12 MED ORDER — LEVOTHYROXINE SODIUM 75 MCG PO TABS
75.0000 ug | ORAL_TABLET | Freq: Every day | ORAL | 3 refills | Status: DC
Start: 2024-03-12 — End: 2024-03-13

## 2024-03-12 MED ORDER — LEVONORGEST-ETH EST & ETH EST 42-21-21-7 DAYS PO TABS: 1.0000 | ORAL_TABLET | Freq: Every day | ORAL | 3 refills | Status: DC

## 2024-03-12 NOTE — Patient Instructions (Addendum)
 I have ordered you mammogram at [facility].   EXERCISE AND DIET:  We recommended that you start or continue a regular exercise program for good health. Regular exercise means any activity that makes your heart beat faster and makes you sweat.  We recommend exercising at least 30 minutes per day at least 3 days a week, preferably 4 or 5.  We also recommend a diet low in fat and sugar.  Inactivity, poor dietary choices and obesity can cause diabetes, heart attack, stroke, and kidney damage, among others.    ALCOHOL AND SMOKING:  Women should limit their alcohol intake to no more than 7 drinks/beers/glasses of wine (combined, not each!) per week. Moderation of alcohol intake to this level decreases your risk of breast cancer and liver damage. And of course, no recreational drugs are part of a healthy lifestyle.  And absolutely no smoking or even second hand smoke. Most people know smoking can cause heart and lung diseases, but did you know it also contributes to weakening of your bones? Aging of your skin?  Yellowing of your teeth and nails?  CALCIUM AND VITAMIN D:  Adequate intake of calcium and Vitamin D are recommended.  The recommendations for exact amounts of these supplements seem to change often, but generally speaking 600 mg of calcium (either carbonate or citrate) and 800 units of Vitamin D per day seems prudent. Certain women may benefit from higher intake of Vitamin D.  If you are among these women, your doctor will have told you during your visit.    PAP SMEARS:  Pap smears, to check for cervical cancer or precancers,  have traditionally been done yearly, although recent scientific advances have shown that most women can have pap smears less often.  However, every woman still should have a physical exam from her gynecologist every year. It will include a breast check, inspection of the vulva and vagina to check for abnormal growths or skin changes, a visual exam of the cervix, and  then an exam to evaluate the size and shape of the uterus and ovaries.  And after 42 years of age, a rectal exam is indicated to check for rectal cancers. We will also provide age appropriate advice regarding health maintenance, like when you should have certain vaccines, screening for sexually transmitted diseases, bone density testing, colonoscopy, mammograms, etc.   MAMMOGRAMS:  All women over 80 years old should have a yearly mammogram. Many facilities now offer a "3D" mammogram, which may cost around $50 extra out of pocket. If possible,  we recommend you accept the option to have the 3D mammogram performed.  It both reduces the number of women who will be called back for extra views which then turn out to be normal, and it is better than the routine mammogram at detecting truly abnormal areas.    COLONOSCOPY:  Colonoscopy to screen for colon cancer is recommended for all women at age 45.  We know, you hate the idea of the prep.  We agree, BUT, having colon cancer and not knowing it is worse!!  Colon cancer so often starts as a polyp that can be seen and removed at colonscopy, which can quite literally save your life!  And if your first colonoscopy is normal and you have no family history of colon cancer, most women don't have to have it again for 10 years.  Once every ten years, you can do something that may end up saving your life, right?  We will be  happy to help you get it scheduled when you are ready.  Be sure to check your insurance coverage so you understand how much it will cost.  It may be covered as a preventative service at no cost, but you should check your particular policy.

## 2024-03-13 ENCOUNTER — Other Ambulatory Visit: Payer: Self-pay | Admitting: Obstetrics and Gynecology

## 2024-03-13 ENCOUNTER — Ambulatory Visit: Payer: Self-pay | Admitting: Obstetrics and Gynecology

## 2024-03-13 DIAGNOSIS — E039 Hypothyroidism, unspecified: Secondary | ICD-10-CM

## 2024-03-13 LAB — COMPREHENSIVE METABOLIC PANEL WITH GFR
AG Ratio: 1.2 (calc) (ref 1.0–2.5)
ALT: 9 U/L (ref 6–29)
AST: 11 U/L (ref 10–30)
Albumin: 4.1 g/dL (ref 3.6–5.1)
Alkaline phosphatase (APISO): 85 U/L (ref 31–125)
BUN: 9 mg/dL (ref 7–25)
CO2: 23 mmol/L (ref 20–32)
Calcium: 9.2 mg/dL (ref 8.6–10.2)
Chloride: 103 mmol/L (ref 98–110)
Creat: 0.92 mg/dL (ref 0.50–0.99)
Globulin: 3.3 g/dL (ref 1.9–3.7)
Glucose, Bld: 84 mg/dL (ref 65–99)
Potassium: 4.4 mmol/L (ref 3.5–5.3)
Sodium: 137 mmol/L (ref 135–146)
Total Bilirubin: 0.5 mg/dL (ref 0.2–1.2)
Total Protein: 7.4 g/dL (ref 6.1–8.1)
eGFR: 80 mL/min/{1.73_m2} (ref >=60)

## 2024-03-13 LAB — CBC
HCT: 43.8 % (ref 35.0–45.0)
Hemoglobin: 14.4 g/dL (ref 11.7–15.5)
MCH: 28.9 pg (ref 27.0–33.0)
MCHC: 32.9 g/dL (ref 32.0–36.0)
MCV: 88 fL (ref 80.0–100.0)
MPV: 9.5 fL (ref 7.5–12.5)
Platelets: 365 10*3/uL (ref 140–400)
RBC: 4.98 10*6/uL (ref 3.80–5.10)
RDW: 13 % (ref 11.0–15.0)
WBC: 7.9 10*3/uL (ref 3.8–10.8)

## 2024-03-13 LAB — IRON: Iron: 60 ug/dL (ref 40–190)

## 2024-03-13 LAB — T4, FREE: Free T4: 1.1 ng/dL (ref 0.8–1.8)

## 2024-03-13 LAB — TSH: TSH: 5.21 m[IU]/L — ABNORMAL HIGH

## 2024-03-13 LAB — LIPID PANEL
Cholesterol: 204 mg/dL — ABNORMAL HIGH (ref <200)
HDL: 40 mg/dL — ABNORMAL LOW (ref >=50)
LDL Cholesterol (Calc): 139 mg/dL — ABNORMAL HIGH
Non-HDL Cholesterol (Calc): 164 mg/dL — ABNORMAL HIGH (ref <130)
Total CHOL/HDL Ratio: 5.1 (calc) — ABNORMAL HIGH (ref <5.0)
Triglycerides: 126 mg/dL (ref <150)

## 2024-03-13 LAB — FERRITIN: Ferritin: 20 ng/mL (ref 16–232)

## 2024-03-13 MED ORDER — LEVOTHYROXINE SODIUM 88 MCG PO TABS: 88.0000 ug | ORAL_TABLET | Freq: Every day | ORAL | 3 refills | Status: DC

## 2024-03-13 NOTE — Progress Notes (Signed)
 New dosage of levothyroxine  88 mcg daily.  Future orders for TSH and free T4.

## 2024-05-08 ENCOUNTER — Other Ambulatory Visit

## 2024-05-08 DIAGNOSIS — E039 Hypothyroidism, unspecified: Secondary | ICD-10-CM

## 2024-05-08 LAB — TSH: TSH: 7.7 m[IU]/L — ABNORMAL HIGH

## 2024-05-08 LAB — T4, FREE: Free T4: 1.1 ng/dL (ref 0.8–1.8)

## 2024-05-09 ENCOUNTER — Ambulatory Visit: Payer: Self-pay | Admitting: Obstetrics and Gynecology

## 2024-05-09 ENCOUNTER — Other Ambulatory Visit: Payer: Self-pay | Admitting: Obstetrics and Gynecology

## 2024-05-09 DIAGNOSIS — E039 Hypothyroidism, unspecified: Secondary | ICD-10-CM

## 2024-05-09 MED ORDER — LEVOTHYROXINE SODIUM 100 MCG PO TABS: 100.0000 ug | ORAL_TABLET | Freq: Every day | ORAL | 2 refills | Status: AC

## 2024-05-09 NOTE — Progress Notes (Signed)
 Synthroid   100 mcg  Future orders for TSH and free T4.

## 2024-05-20 ENCOUNTER — Other Ambulatory Visit: Payer: Self-pay | Admitting: Obstetrics and Gynecology

## 2024-05-21 NOTE — Telephone Encounter (Signed)
 Med refill request: Levonorgest-Eth Est Last AEX: 03/12/24 BS Next AEX: not yet scheduled Last MMG (if hormonal med) Refill authorized: Last Rx sent #[phone] with 3 refills on 03/12/24 BS. Please approve or deny
